# Patient Record
Sex: Male | Born: 1950 | Race: White | Hispanic: No | Marital: Married | Smoking: Former smoker
Health system: Southern US, Community
[De-identification: ages and names within clinical notes are randomized; demographics above are authoritative.]

## PROBLEM LIST (undated history)

## (undated) DIAGNOSIS — I10 Essential (primary) hypertension: Secondary | ICD-10-CM

## (undated) HISTORY — PX: EYE SURGERY: SHX253

---

## 2011-01-18 ENCOUNTER — Encounter: Payer: Self-pay | Admitting: Family Medicine

## 2011-01-18 ENCOUNTER — Ambulatory Visit (INDEPENDENT_AMBULATORY_CARE_PROVIDER_SITE_OTHER): Payer: BC Managed Care – PPO | Admitting: Family Medicine

## 2011-01-18 DIAGNOSIS — M7660 Achilles tendinitis, unspecified leg: Secondary | ICD-10-CM

## 2011-01-26 ENCOUNTER — Telehealth (INDEPENDENT_AMBULATORY_CARE_PROVIDER_SITE_OTHER): Payer: Self-pay | Admitting: *Deleted

## 2011-01-26 NOTE — Assessment & Plan Note (Signed)
Summary: RIGHT HEEL PAIN (rm 4)   Vital Signs:  Patient Profile:   60 Years Old Male CC:      right heel pain x 2 days Height:     68.5 inches Weight:      145 pounds O2 Sat:      99 % O2 treatment:    Room Air Temp:     98.7 degrees F oral Pulse rate:   89 / minute Resp:     16 per minute BP sitting:   151 / 81  (left arm) Cuff size:   regular  Pt. in pain?   yes    Location:   right heel    Type:       dull/ache  Vitals Entered By: Lajean Saver RN (January 18, 2011 3:55 PM)                   Updated Prior Medication List: LISINOPRIL-HYDROCHLOROTHIAZIDE 20-12.5 MG TABS (LISINOPRIL-HYDROCHLOROTHIAZIDE) once daily  Current Allergies: No known allergies History of Present Illness Chief Complaint: right heel pain x 2 days History of Present Illness:  Subjective:  Patient pushed a car three days ago, and that evening noticed soreness in the posterior aspect of his right heel.  He has been applying ice.  The soreness has improved, but he has discomfort when climbing stairs. He notes that heel feels better when he wears an ankle/heel brace.  REVIEW OF SYSTEMS Constitutional Symptoms      Denies fever, chills, night sweats, weight loss, weight gain, and fatigue.  Eyes       Denies change in vision, eye pain, eye discharge, glasses, contact lenses, and eye surgery. Ear/Nose/Throat/Mouth       Denies hearing loss/aids, change in hearing, ear pain, ear discharge, dizziness, frequent runny nose, frequent nose bleeds, sinus problems, sore throat, hoarseness, and tooth pain or bleeding.  Respiratory       Denies dry cough, productive cough, wheezing, shortness of breath, asthma, bronchitis, and emphysema/COPD.  Cardiovascular       Denies murmurs, chest pain, and tires easily with exhertion.    Gastrointestinal       Denies stomach pain, nausea/vomiting, diarrhea, constipation, blood in bowel movements, and indigestion. Genitourniary       Denies painful urination, kidney  stones, and loss of urinary control. Neurological       Denies paralysis, seizures, and fainting/blackouts. Musculoskeletal       Complains of muscle pain, joint pain, and decreased range of motion.      Denies joint stiffness, redness, swelling, muscle weakness, and gout.      Comments: right heel Skin       Denies bruising, unusual mles/lumps or sores, and hair/skin or nail changes.  Psych       Denies mood changes, temper/anger issues, anxiety/stress, speech problems, depression, and sleep problems.  Past History:  Past Medical History: htn glacuoma  Past Surgical History: left corneal transplant Hemorrhoidectomy  Social History: Never Smoked Alcohol use-yes Drug use-no Smoking Status:  never Drug Use:  no   Objective:  Appearance:  Patient appears healthy, stated age, and in no acute distress  Right posterior heel:  No swelling, erythema, or warmth.  There is point tenderness at insertion achilles tendon.  Ankle has full range of motion.  Mild pain elicited with resisted plantar flexion of right ankle. Assessment New Problems: ACHILLES TENDINITIS (ICD-726.71)   Plan New Medications/Changes: LORTAB 5 5-500 MG TABS (HYDROCODONE-ACETAMINOPHEN) One or two tabs by mouth hs  as needed pain  #7 (seven) x 0, 01/18/2011, Donna Christen MD NAPROXEN 500 MG TABS (NAPROXEN) One by mouth two times a day pc  #14 x 1, 01/18/2011, Donna Christen MD  New Orders: New Patient Level III [id number] Planning Comments:   Continue applying ice pack.  Continue ankle/achilles brace.  Begin Naproxen; analgesic at bedtime.  Begin stretching exercises ([facility] information and instruction patient handout given)  If not improving 10 to 14 days follow-up with sports med clinic   The patient and/or caregiver has been counseled thoroughly with regard to medications prescribed including dosage, schedule, interactions, rationale for use, and possible side effects and they verbalize understanding.   Diagnoses and expected course of recovery discussed and will return if not improved as expected or if the condition worsens. Patient and/or caregiver verbalized understanding.  Prescriptions: LORTAB 5 5-500 MG TABS (HYDROCODONE-ACETAMINOPHEN) One or two tabs by mouth hs as needed pain  #7 (seven) x 0   Entered and Authorized by:   Donna Christen MD   Signed by:   Donna Christen MD on 01/18/2011   Method used:   Print then Give to Patient   RxID:   [id number] NAPROXEN 500 MG TABS (NAPROXEN) One by mouth two times a day pc  #14 x 1   Entered and Authorized by:   Donna Christen MD   Signed by:   Donna Christen MD on 01/18/2011   Method used:   Print then Give to Patient   RxID:   [id number]   Orders Added: 1)  New Patient Level III [29528]

## 2011-02-04 NOTE — Progress Notes (Signed)
  Phone Note Outgoing Call   Call placed by: Clemens Catholic LPN,  January 26, 2011 5:14 PM Call placed to: Patient Summary of Call: call back : pt states that his heel is feeling better, still a little tender. told him to call back if he has any questions or concerns. Initial call taken by: Clemens Catholic LPN,  January 26, 2011 5:14 PM

## 2012-03-27 ENCOUNTER — Emergency Department
Admission: EM | Admit: 2012-03-27 | Discharge: 2012-03-27 | Disposition: A | Discharge status: Home / Self Care | Payer: BC Managed Care – PPO | Source: Home / Self Care | Attending: Family Medicine | Admitting: Family Medicine

## 2012-03-27 ENCOUNTER — Encounter: Payer: Self-pay | Admitting: Emergency Medicine

## 2012-03-27 DIAGNOSIS — J069 Acute upper respiratory infection, unspecified: Secondary | ICD-10-CM

## 2012-03-27 HISTORY — DX: Essential (primary) hypertension: I10

## 2012-03-27 NOTE — ED Notes (Signed)
Sinus drainage, headache, productive cough, runny nose x 4 days

## 2012-03-27 NOTE — Discharge Instructions (Signed)
Using Saline Nose Drops with Bulb Syringe A bulb syringe is used to clear your infant's nose and mouth. You may use it when your infant spits up, has a stuffy nose, or sneezes. Infants cannot blow their nose so you need to use a bulb syringe to clear their airway. This helps your infant suck on a bottle or nurse and still be able to breathe. USING THE BULB SYRINGE  Squeeze the air out of the bulb before inserting it into your infant's nose.   While still squeezing the bulb flat, place the tip of the bulb into a nostril. Let air come back into the bulb. The suction will pull snot out of the nose and into the bulb.   Repeat on the other nostril.   Squeeze syringe several times into a tissue.  USE THE BULB IN COMBINATION WITH SALINE NOSE DROPS  Put 1 or 2 salt water drops in each side of infant's nose with a clean medicine dropper.   Salt water nose drops will then moisten your infant's congested nose and loosen secretions before suctioning.   Use the bulb syringe as directed above.   Do not dry suction your infants nostrils. This can irritate their nostrils.  You can buy nose drops at your local drug store. You can also make nose drops yourself. Mix 1 cup of water with  teaspoon of salt. Stir. Store this mixture at room temperature. Make a new batch daily. CLEANING THE BULB SYRINGE Clean the bulb syringe every day with hot soapy water.   Clean the inside of the bulb by squeezing the bulb while the tip is in soapy water.   Rinse by squeezing the bulb while the tip is in clean hot water.   Store the bulb with the tip side down on paper towel.  HOME CARE INSTRUCTIONS   Use saline nose drops often to keep the nose open and not stuffy. It works better than suctioning with the bulb syringe, which can cause minor bruising inside the child's nose. Sometimes, you may have to use bulb suctioning. However, it is strongly believed that saline rinsing of the nostrils is more effective in keeping the  nose open. This is especially important for the infant who needs an open nose to be able to suck with a closed mouth.   Throw away used salt water. Make a new solution every time.   Always clean your child's nose before feeding.   Do not use the same solution and dropper for another child.  Document Released: 04/12/2008 Document Revised: 10/14/2011 Document Reviewed: 04/12/2008 [facility] Patient Information 2012 [place], [place].Upper Respiratory Infection, Adult An upper respiratory infection (URI) is also known as the common cold. It is often caused by a type of germ (virus). Colds are easily spread (contagious). You can pass it to others by kissing, coughing, sneezing, or drinking out of the same glass. Usually, you get better in 1 or 2 weeks.  HOME CARE   Only take medicine as told by your doctor.   Use a warm mist humidifier or breathe in steam from a hot shower.   Drink enough water and fluids to keep your pee (urine) clear or pale yellow.   Get plenty of rest.   Return to work when your temperature is back to normal or as told by your doctor. You may use a face mask and wash your hands to stop your cold from spreading.  GET HELP RIGHT AWAY IF:   After the first few days,  you feel you are getting worse.   You have questions about your medicine.   You have chills, shortness of breath, or brown or red spit (mucus).   You have yellow or brown snot (nasal discharge) or pain in the face, especially when you bend forward.   You have a fever, puffy (swollen) neck, pain when you swallow, or white spots in the back of your throat.   You have a bad headache, ear pain, sinus pain, or chest pain.   You have a high-pitched whistling sound when you breathe in and out (wheezing).   You have a lasting cough or cough up blood.   You have sore muscles or a stiff neck.  MAKE SURE YOU:   Understand these instructions.   Will watch your condition.   Will get help right away if you are  not doing well or get worse.  Document Released: 04/12/2008 Document Revised: 10/14/2011 Document Reviewed: 03/01/2011 [facility] Patient Information 2012 [place], [place].

## 2012-03-27 NOTE — ED Provider Notes (Signed)
History     CSN: [id number]  Arrival date & time 03/27/12  1029   First MD Initiated Contact with Patient 03/27/12 1040      Chief Complaint  Patient presents with  . Sinus Problem    (Consider location/radiation/quality/duration/timing/severity/associated sxs/prior treatment) Patient is a 61 y.o. male presenting with sinus complaint. The history is provided by the patient.  Sinus Problem  Mike Bauer is a 61 y.o. male who complains of onset of sinus congestion for 4 days.  scratchy sore throat + cough, non productive No pleuritic pain No wheezing + nasal congestion + post-nasal drainage No sinus pain/pressure No chest congestion + itchy/red eyes No earache No hemoptysis No SOB No chills/sweats No fever No nausea No vomiting No abdominal pain No diarrhea No skin rashes No fatigue No myalgias No headache    Past Medical History  Diagnosis Date  . Hypertension     History reviewed. No pertinent past surgical history.  Family History  Problem Relation Age of Onset  . Hyperlipidemia Mother   . Hypertension Mother     History  Substance Use Topics  . Smoking status: Former Games developer  . Smokeless tobacco: Not on file  . Alcohol Use: Yes      Review of Systems  All other systems reviewed and are negative.    Allergies  Review of patient's allergies indicates no known allergies.  Home Medications   Current Outpatient Rx  Name Route Sig Dispense Refill  . OLMESARTAN MEDOXOMIL-HCTZ 20-12.5 MG PO TABS Oral Take 1 tablet by mouth daily.      BP 112/76  Pulse 77  Temp(Src) 98.4 F (36.9 C) (Oral)  Resp 16  Ht 5\' 9"  (1.753 m)  Wt 142 lb (64.411 kg)  BMI 20.97 kg/m2  SpO2 99%  Physical Exam  Nursing note and vitals reviewed. Constitutional: He is oriented to person, place, and time. Vital signs are normal. He appears well-developed and well-nourished. He is active and cooperative.  HENT:  Head: Normocephalic.  Right Ear: Hearing, tympanic  membrane, external ear and ear canal normal.  Left Ear: Hearing, tympanic membrane, external ear and ear canal normal.  Nose: Nose normal.  Mouth/Throat: Uvula is midline and mucous membranes are normal. Posterior oropharyngeal erythema present.  Eyes: Conjunctivae are normal. No scleral icterus. Left pupil is not reactive.       Hx of injury to left eye many years ago  Neck: Trachea normal. Neck supple.  Cardiovascular: Normal rate, regular rhythm and normal heart sounds.   Pulmonary/Chest: Effort normal and breath sounds normal.  Lymphadenopathy:       Head (right side): No tonsillar, no preauricular, no posterior auricular and no occipital adenopathy present.       Head (left side): No tonsillar, no preauricular, no posterior auricular and no occipital adenopathy present.    He has no cervical adenopathy.  Neurological: He is alert and oriented to person, place, and time. No cranial nerve deficit or sensory deficit.  Skin: Skin is warm and dry.  Psychiatric: He has a normal mood and affect. His speech is normal and behavior is normal. Judgment and thought content normal. Cognition and memory are normal.    ED Course  Procedures (including critical care time)  Labs Reviewed - No data to display No results found.   1. URI (upper respiratory infection)       MDM  Increase fluid intake, rest.  No antibiotics indicated at this time.  Begin expectorant/decongestant, topical decongestant-stay clear of OTC with "  D", saline nasal spray and/or saline irrigation, and cough suppressant at bedtime. Antihistamines of your choice (Claritin or Zyrtec).  Tylenol or Motrin for fever/discomfort.  Followup with PCP if not improving 7 to 10 days.         Johnsie Kindred, NP 03/27/12 1110

## 2012-03-28 NOTE — ED Provider Notes (Signed)
Agree with exam, assessment, and plan.   Lattie Haw, MD 03/28/12 [phone]

## 2013-05-15 ENCOUNTER — Emergency Department
Admission: EM | Admit: 2013-05-15 | Discharge: 2013-05-15 | Disposition: A | Discharge status: Home / Self Care | Payer: BC Managed Care – PPO | Source: Home / Self Care | Attending: Emergency Medicine | Admitting: Emergency Medicine

## 2013-05-15 ENCOUNTER — Encounter: Payer: Self-pay | Admitting: Emergency Medicine

## 2013-05-15 DIAGNOSIS — H8110 Benign paroxysmal vertigo, unspecified ear: Secondary | ICD-10-CM

## 2013-05-15 MED ORDER — MECLIZINE HCL 25 MG PO TABS: 25.0000 mg | ORAL_TABLET | Freq: Three times a day (TID) | ORAL | Status: AC | PRN

## 2013-05-15 NOTE — ED Provider Notes (Signed)
History    CSN: [id number] Arrival date & time 05/15/13  1003  First MD Initiated Contact with Patient 05/15/13 1040     Chief Complaint  Patient presents with  . Dizziness   (Consider location/radiation/quality/duration/timing/severity/associated sxs/prior Treatment) HPI This patient comes in with intermittent positional dizziness for the last few weeks.  He does have a family history of injury or problems.  The person does have any.  He does have blood pressure issues and takes his medicines faithfully.  No chest pain, short of breath, only minimal nausea during symptoms.  He states that the episodes only occur when changing positions such as from laying down to standing up for a while looking up to the ceiling.  He did take some Benadryl which helped.  No recent sinus infections or ear pain.  No ear ringing.  No weakness, numbness, visual problems.  Past Medical History  Diagnosis Date  . Hypertension    Past Surgical History  Procedure Laterality Date  . Eye surgery Left    Family History  Problem Relation Age of Onset  . Hyperlipidemia Mother   . Hypertension Mother    History  Substance Use Topics  . Smoking status: Former Games developer  . Smokeless tobacco: Not on file  . Alcohol Use: Yes    Review of Systems  All other systems reviewed and are negative.    Allergies  Review of patient's allergies indicates no known allergies.  Home Medications   Current Outpatient Rx  Name  Route  Sig  Dispense  Refill  . aspirin 81 MG tablet   Oral   Take 81 mg by mouth daily.         . dorzolamide-timolol (COSOPT) 22.3-6.8 MG/ML ophthalmic solution      1 drop 2 (two) times daily.         . hydrochlorothiazide (MICROZIDE) 12.5 MG capsule   Oral   Take 12.5 mg by mouth daily.         Marland Kitchen lisinopril (PRINIVIL,ZESTRIL) 20 MG tablet   Oral   Take 20 mg by mouth daily.         . meclizine (ANTIVERT) 25 MG tablet   Oral   Take 1 tablet (25 mg total) by mouth 3  (three) times daily as needed.   30 tablet   0   . olmesartan-hydrochlorothiazide (BENICAR HCT) 20-12.5 MG per tablet   Oral   Take 1 tablet by mouth daily.          BP 147/81  Pulse 73  Temp(Src) 98.2 F (36.8 C) (Oral)  Resp 16  Ht 5' 8.5" (1.74 m)  Wt 145 lb (65.772 kg)  BMI 21.72 kg/m2  SpO2 99% Physical Exam  Nursing note and vitals reviewed. Constitutional: He is oriented to person, place, and time. He appears well-developed and well-nourished. No distress.  HENT:  Head: Normocephalic and atraumatic.  Right Ear: Tympanic membrane and ear canal normal.  Left Ear: Tympanic membrane and ear canal normal.  Nose: Nose normal.  Mouth/Throat: Uvula is midline and oropharynx is clear and moist.  Eyes: No scleral icterus.  Left thigh abnormal pupil and pupillary reactivity (chornic and secondary to an old injury).  Positive horizontal my statements with extreme lateral deviation.  No vertical nystagmus.  Neck: Normal range of motion and full passive range of motion without pain. Neck supple. Carotid bruit is not present. No rigidity.  Cardiovascular: Normal rate, regular rhythm and normal heart sounds.   Pulmonary/Chest: Effort normal and  breath sounds normal. No respiratory distress. He has no decreased breath sounds. He has no wheezes.  Neurological: He is alert and oriented to person, place, and time. He displays normal reflexes. No cranial nerve deficit or sensory deficit. He displays a negative Romberg sign. GCS eye subscore is 4. GCS verbal subscore is 5. GCS motor subscore is 6.  Skin: Skin is warm. He is not diaphoretic.  Psychiatric: He has a normal mood and affect. His speech is normal.    ED Course  Procedures (including critical care time) Labs Reviewed - No data to display No results found. 1. BPPV (benign paroxysmal positional vertigo)     MDM   This patient appears to have benign positional vertigo.  He did not have any signs of any intracranial process or  Mnire's disease.  I gave him prescription for meclizine that he is having these for the next few days.  Sedation cautions are given.  He should hydrate as well.  Other treatment options discussed are Phenergan which he does not feel he needs because he did not bring nauseated.  If he improves then he can followup as needed.  If he is not improving, we need to consider getting Lyme's titers since he does have a history of being bit by ticks while working in his yard.  Patient understands and will call back.  Tilson is controlled blood pressure.  ER precautions are given for severe dizziness.  Marlaine Hind, MD 05/15/13 1102

## 2013-05-15 NOTE — ED Notes (Signed)
Gives history of 3 weeks intermittent dizziness, especially when changing position quickly. Taking HBP medications as directed. Has removed 2 ticks, and working out of doors. No actual loss of consciousness.

## 2013-08-25 ENCOUNTER — Encounter: Payer: Self-pay | Admitting: Emergency Medicine

## 2013-08-25 ENCOUNTER — Emergency Department
Admission: EM | Admit: 2013-08-25 | Discharge: 2013-08-25 | Disposition: A | Discharge status: Home / Self Care | Payer: BC Managed Care – PPO | Source: Home / Self Care | Attending: Family Medicine | Admitting: Family Medicine

## 2013-08-25 DIAGNOSIS — S61411A Laceration without foreign body of right hand, initial encounter: Secondary | ICD-10-CM

## 2013-08-25 DIAGNOSIS — S61409A Unspecified open wound of unspecified hand, initial encounter: Secondary | ICD-10-CM

## 2013-08-25 MED ORDER — TETANUS-DIPHTH-ACELL PERTUSSIS 5-2.5-18.5 LF-MCG/0.5 IM SUSP: 0.5000 mL | Freq: Once | INTRAMUSCULAR | Status: DC

## 2013-08-25 NOTE — ED Notes (Signed)
Cut palm of right hand on lawn mower belt PTA, rates pain 8/10

## 2013-08-25 NOTE — ED Provider Notes (Signed)
CSN: [id number]     Arrival date & time 08/25/13  1513 History   First MD Initiated Contact with Patient 08/25/13 1542     Chief Complaint  Patient presents with  . Extremity Laceration    right hand      HPI Comments: About 30 minutes ago, patient cut the palm of his right hand on the edge of a lawn mower belt pulley.  He does not remember his last tetanus immunization  Patient is a 62 y.o. male presenting with skin laceration. The history is provided by the patient.  Laceration Location:  Hand Hand laceration location:  R palm Length (cm):  2.5 Depth:  Through dermis Quality: straight   Bleeding: controlled   Time since incident:  30 minutes Laceration mechanism:  Metal edge Pain details:    Quality:  Aching   Severity:  Mild   Timing:  Constant   Progression:  Unchanged Foreign body present:  No foreign bodies Relieved by:  Nothing Worsened by:  Movement Tetanus status:  Unknown   Past Medical History  Diagnosis Date  . Hypertension    Past Surgical History  Procedure Laterality Date  . Eye surgery Left    Family History  Problem Relation Age of Onset  . Hyperlipidemia Mother   . Hypertension Mother    History  Substance Use Topics  . Smoking status: Former Games developer  . Smokeless tobacco: Not on file  . Alcohol Use: Yes    Review of Systems  All other systems reviewed and are negative.    Allergies  Review of patient's allergies indicates no known allergies.  Home Medications   Current Outpatient Rx  Name  Route  Sig  Dispense  Refill  . aspirin 81 MG tablet   Oral   Take 81 mg by mouth daily.         . dorzolamide-timolol (COSOPT) 22.3-6.8 MG/ML ophthalmic solution      1 drop 2 (two) times daily.         . hydrochlorothiazide (MICROZIDE) 12.5 MG capsule   Oral   Take 12.5 mg by mouth daily.         Marland Kitchen lisinopril (PRINIVIL,ZESTRIL) 20 MG tablet   Oral   Take 20 mg by mouth daily.         . meclizine (ANTIVERT) 25 MG tablet  Oral   Take 1 tablet (25 mg total) by mouth 3 (three) times daily as needed.   30 tablet   0   . olmesartan-hydrochlorothiazide (BENICAR HCT) 20-12.5 MG per tablet   Oral   Take 1 tablet by mouth daily.          BP 120/77  Pulse 103  Temp(Src) 98.1 F (36.7 C) (Oral)  Ht 5\' 8"  (1.727 m)  Wt 135 lb (61.236 kg)  BMI 20.53 kg/m2  SpO2 97% Physical Exam  Nursing note and vitals reviewed. Constitutional: He is oriented to person, place, and time. He appears well-developed and well-nourished. No distress.  HENT:  Head: Atraumatic.  Eyes: Conjunctivae are normal. Pupils are equal, round, and reactive to light.  Musculoskeletal: Normal range of motion.       Right hand: He exhibits laceration. He exhibits normal range of motion, no tenderness, normal two-point discrimination, normal capillary refill and no swelling. Normal sensation noted. Normal strength noted.       Hands: Palm of right hand has a simple 2.5cm long linear laceration through the dermis as noted on diagram.  Neurological: He is alert  and oriented to person, place, and time.  Skin: Skin is warm and dry.    ED Course  Procedures  Laceration Repair Discussed benefits and risks of procedure and verbal consent obtained. Using sterile technique and local 1% lidocaine without epinephrine, cleansed wound with Betadine followed by copious lavage with normal saline.  Wound carefully inspected for debris and foreign bodies; none found.  Wound closed with # 7, 4-0 interrupted nylon sutures.  Bacitracin and non-stick sterile dressing applied.  Wound precautions explained to patient.  Return for suture removal in 10 days.        MDM   1. Laceration of right hand, initial encounter    Tdap administered Change dressing daily and apply Bacitracin ointment to wound.  Keep wound clean and dry.  Return for any signs of infection (or follow-up with family doctor):  Increasing redness, swelling, pain, heat, drainage, etc. Return  in 10 days for suture removal.    Lattie Haw, MD 08/25/13 [phone]

## 2013-11-22 ENCOUNTER — Encounter: Payer: Self-pay | Admitting: Emergency Medicine

## 2013-11-22 ENCOUNTER — Emergency Department (INDEPENDENT_AMBULATORY_CARE_PROVIDER_SITE_OTHER)
Admission: EM | Admit: 2013-11-22 | Discharge: 2013-11-22 | Disposition: A | Discharge status: Home / Self Care | Payer: BC Managed Care – PPO | Source: Home / Self Care | Attending: Family Medicine | Admitting: Family Medicine

## 2013-11-22 DIAGNOSIS — J309 Allergic rhinitis, unspecified: Secondary | ICD-10-CM

## 2013-11-22 DIAGNOSIS — J069 Acute upper respiratory infection, unspecified: Secondary | ICD-10-CM

## 2013-11-22 DIAGNOSIS — J329 Chronic sinusitis, unspecified: Secondary | ICD-10-CM

## 2013-11-22 MED ORDER — AZITHROMYCIN 250 MG PO TABS: ORAL_TABLET | ORAL | Status: DC

## 2013-11-22 MED ORDER — METHYLPREDNISOLONE ACETATE 80 MG/ML IJ SUSP
80.0000 mg | Freq: Once | INTRAMUSCULAR | Status: AC
  Administered 2013-11-22: 80 mg via INTRAMUSCULAR

## 2013-11-22 MED ORDER — FLUTICASONE PROPIONATE 50 MCG/ACT NA SUSP: 2.0000 | Freq: Every day | NASAL | Status: AC

## 2013-11-22 MED ORDER — CETIRIZINE HCL 10 MG PO CAPS: 10.0000 mg | ORAL_CAPSULE | Freq: Every day | ORAL | Status: DC

## 2013-11-22 NOTE — ED Provider Notes (Addendum)
CSN: [id number]     Arrival date & time 11/22/13  1317 History   None    Chief Complaint  Patient presents with  . Cough    HPI URI Symptoms Onset: 2-3 months  Description: intermittent, rhinorrhea, nasal congestion, cough  Modifying factors:  none  Symptoms Nasal discharge: yes Fever: no Sore throat: no Cough: yes Wheezing: no Ear pain: no GI symptoms: no Sick contacts: no  Red Flags  Stiff neck: no Dyspnea: no Rash: no Swallowing difficulty: no  Sinusitis Risk Factors Headache/face pain: no Double sickening: no tooth pain: no  Allergy Risk Factors Sneezing: yes Itchy scratchy throat: yes Seasonal symptoms: yes  Flu Risk Factors Headache: no muscle aches: no severe fatigue: no    Past Medical History  Diagnosis Date  . Hypertension    Past Surgical History  Procedure Laterality Date  . Eye surgery Left    Family History  Problem Relation Age of Onset  . Hyperlipidemia Mother   . Hypertension Mother    History  Substance Use Topics  . Smoking status: Former Smoker    Quit date: 11/22/1998  . Smokeless tobacco: Never Used  . Alcohol Use: Yes    Review of Systems  All other systems reviewed and are negative.    Allergies  Review of patient's allergies indicates no known allergies.  Home Medications   Current Outpatient Rx  Name  Route  Sig  Dispense  Refill  . aspirin 81 MG tablet   Oral   Take 81 mg by mouth daily.         . dorzolamide-timolol (COSOPT) 22.3-6.8 MG/ML ophthalmic solution      1 drop 2 (two) times daily.         . hydrochlorothiazide (MICROZIDE) 12.5 MG capsule   Oral   Take 12.5 mg by mouth daily.         Marland Kitchen. lisinopril (PRINIVIL,ZESTRIL) 20 MG tablet   Oral   Take 20 mg by mouth daily.         Marland Kitchen. azithromycin (ZITHROMAX) 250 MG tablet      Take 2 tabs PO x 1 dose, then 1 tab PO QD x 4 days   6 tablet   0   . Cetirizine HCl 10 MG CAPS   Oral   Take 1 capsule (10 mg total) by mouth daily.   30  capsule   3   . fluticasone (FLONASE) 50 MCG/ACT nasal spray   Each Nare   Place 2 sprays into both nostrils daily.   16 g   12   . meclizine (ANTIVERT) 25 MG tablet   Oral   Take 1 tablet (25 mg total) by mouth 3 (three) times daily as needed.   30 tablet   0    BP 123/73  Pulse 91  Temp(Src) 98.6 F (37 C) (Oral)  Resp 12  Wt 137 lb (62.143 kg)  SpO2 98% Physical Exam  Constitutional: He appears well-developed and well-nourished.  HENT:  Head: Normocephalic and atraumatic.  Right Ear: External ear normal.  Left Ear: External ear normal.  +nasal erythema, rhinorrhea bilaterally, + post oropharyngeal erythema    Eyes: Conjunctivae are normal. Pupils are equal, round, and reactive to light.  Neck: Normal range of motion. Neck supple.  Cardiovascular: Normal rate and regular rhythm.   Pulmonary/Chest: Effort normal and breath sounds normal.  Abdominal: Soft. Bowel sounds are normal.  Musculoskeletal: Normal range of motion.  Neurological: He is alert.    ED  Course  Procedures (including critical care time) Labs Review Labs Reviewed - No data to display Imaging Review No results found.  EKG Interpretation    Date/Time:    Ventricular Rate:    PR Interval:    QRS Duration:   QT Interval:    QTC Calculation:   R Axis:     Text Interpretation:              MDM   1. URI (upper respiratory infection)   2. Allergic rhinitis    Likley overlapping URI and allergic rhinitis.  MIld component of sinusitis.  Depomedrol 80mg  IM x1 Will place on course of zpak  Start flonase and zyrtec.  Discussed infectious and ENT red flags.  Follow up as needed.     The patient and/or caregiver has been counseled thoroughly with regard to treatment plan and/or medications prescribed including dosage, schedule, interactions, rationale for use, and possible side effects and they verbalize understanding. Diagnoses and expected course of recovery discussed and will  return if not improved as expected or if the condition worsens. Patient and/or caregiver verbalized understanding.         Doree Albee, MD 11/22/13 1411  Doree Albee, MD 01/03/14 1055

## 2013-11-22 NOTE — ED Notes (Signed)
Mike Bauer c/o intermittent cough with runny nose and sinus congestion since Nov 2014. No flu vac this season.

## 2013-11-24 ENCOUNTER — Telehealth: Payer: Self-pay | Admitting: Emergency Medicine

## 2016-04-30 ENCOUNTER — Emergency Department (INDEPENDENT_AMBULATORY_CARE_PROVIDER_SITE_OTHER)
Admission: EM | Admit: 2016-04-30 | Discharge: 2016-04-30 | Disposition: A | Discharge status: Home / Self Care | Payer: Medicare HMO | Source: Home / Self Care

## 2016-04-30 ENCOUNTER — Emergency Department (INDEPENDENT_AMBULATORY_CARE_PROVIDER_SITE_OTHER): Payer: Medicare HMO

## 2016-04-30 ENCOUNTER — Encounter: Payer: Self-pay | Admitting: *Deleted

## 2016-04-30 DIAGNOSIS — M5441 Lumbago with sciatica, right side: Secondary | ICD-10-CM

## 2016-04-30 DIAGNOSIS — M5135 Other intervertebral disc degeneration, thoracolumbar region: Secondary | ICD-10-CM | POA: Diagnosis not present

## 2016-04-30 DIAGNOSIS — I700 Atherosclerosis of aorta: Secondary | ICD-10-CM | POA: Diagnosis not present

## 2016-04-30 DIAGNOSIS — M5126 Other intervertebral disc displacement, lumbar region: Secondary | ICD-10-CM | POA: Diagnosis not present

## 2016-04-30 MED ORDER — PREDNISONE 20 MG PO TABS: ORAL_TABLET | ORAL | Status: DC

## 2016-04-30 NOTE — Discharge Instructions (Signed)
Apply ice pack for 20 to 30 minutes, 3 to 4 times daily  Continue until pain decreases.  May take Tylenol if needed for pain at bedtime.  Begin back exercises as tolerated.   Sciatica Sciatica is pain, weakness, numbness, or tingling along the path of the sciatic nerve. The nerve starts in the lower back and runs down the back of each leg. The nerve controls the muscles in the lower leg and in the back of the knee, while also providing sensation to the back of the thigh, lower leg, and the sole of your foot. Sciatica is a symptom of another medical condition. For instance, nerve damage or certain conditions, such as a herniated disk or bone spur on the spine, pinch or put pressure on the sciatic nerve. This causes the pain, weakness, or other sensations normally associated with sciatica. Generally, sciatica only affects one side of the body. CAUSES  1. Herniated or slipped disc. 2. Degenerative disk disease. 3. A pain disorder involving the narrow muscle in the buttocks (piriformis syndrome). 4. Pelvic injury or fracture. 5. Pregnancy. 6. Tumor (rare). SYMPTOMS  Symptoms can vary from mild to very severe. The symptoms usually travel from the low back to the buttocks and down the back of the leg. Symptoms can include: 1. Mild tingling or dull aches in the lower back, leg, or hip. 2. Numbness in the back of the calf or sole of the foot. 3. Burning sensations in the lower back, leg, or hip. 4. Sharp pains in the lower back, leg, or hip. 5. Leg weakness. 6. Severe back pain inhibiting movement. These symptoms may get worse with coughing, sneezing, laughing, or prolonged sitting or standing. Also, being overweight may worsen symptoms. DIAGNOSIS  Your caregiver will perform a physical exam to look for common symptoms of sciatica. He or she may ask you to do certain movements or activities that would trigger sciatic nerve pain. Other tests may be performed to find the cause of the sciatica. These may  include: 1. Blood tests. 2. X-rays. 3. Imaging tests, such as an MRI or CT scan. TREATMENT  Treatment is directed at the cause of the sciatic pain. Sometimes, treatment is not necessary and the pain and discomfort goes away on its own. If treatment is needed, your caregiver may suggest: 1. Over-the-counter medicines to relieve pain. 2. Prescription medicines, such as anti-inflammatory medicine, muscle relaxants, or narcotics. 3. Applying heat or ice to the painful area. 4. Steroid injections to lessen pain, irritation, and inflammation around the nerve. 5. Reducing activity during periods of pain. 6. Exercising and stretching to strengthen your abdomen and improve flexibility of your spine. Your caregiver may suggest losing weight if the extra weight makes the back pain worse. 7. Physical therapy. 8. Surgery to eliminate what is pressing or pinching the nerve, such as a bone spur or part of a herniated disk. HOME CARE INSTRUCTIONS  1. Only take over-the-counter or prescription medicines for pain or discomfort as directed by your caregiver. 2. Apply ice to the affected area for 20 minutes, 3-4 times a day for the first 48-72 hours. Then try heat in the same way. 3. Exercise, stretch, or perform your usual activities if these do not aggravate your pain. 4. Attend physical therapy sessions as directed by your caregiver. 5. Keep all follow-up appointments as directed by your caregiver. 6. Do not wear high heels or shoes that do not provide proper support. 7. Check your mattress to see if it is too soft.  A firm mattress may lessen your pain and discomfort. SEEK IMMEDIATE MEDICAL CARE IF:  1. You lose control of your bowel or bladder (incontinence). 2. You have increasing weakness in the lower back, pelvis, buttocks, or legs. 3. You have redness or swelling of your back. 4. You have a burning sensation when you urinate. 5. You have pain that gets worse when you lie down or awakens you at  night. 6. Your pain is worse than you have experienced in the past. 7. Your pain is lasting longer than 4 weeks. 8. You are suddenly losing weight without reason. MAKE SURE YOU: 1. Understand these instructions. 2. Will watch your condition. 3. Will get help right away if you are not doing well or get worse.   This information is not intended to replace advice given to you by your health care provider. Make sure you discuss any questions you have with your health care provider.   Document Released: 10/19/2001 Document Revised: 07/16/2015 Document Reviewed: 03/05/2012 Elsevier Interactive Patient Education 2016 [employer]      Back Exercises The following exercises strengthen the muscles that help to support the back. They also help to keep the lower back flexible. Doing these exercises can help to prevent back pain or lessen existing pain. If you have back pain or discomfort, try doing these exercises 2-3 times each day or as told by your health care provider. When the pain goes away, do them once each day, but increase the number of times that you repeat the steps for each exercise (do more repetitions). If you do not have back pain or discomfort, do these exercises once each day or as told by your health care provider. EXERCISES Single Knee to Chest Repeat these steps 3-5 times for each leg: 7. Lie on your back on a firm bed or the floor with your legs extended. 8. Bring one knee to your chest. Your other leg should stay extended and in contact with the floor. 9. Hold your knee in place by grabbing your knee or thigh. 10. Pull on your knee until you feel a gentle stretch in your lower back. 11. Hold the stretch for 10-30 seconds. 12. Slowly release and straighten your leg. Pelvic Tilt Repeat these steps 5-10 times: 7. Lie on your back on a firm bed or the floor with your legs extended. 8. Bend your knees so they are pointing toward the ceiling and your feet are flat on the  floor. 9. Tighten your lower abdominal muscles to press your lower back against the floor. This motion will tilt your pelvis so your tailbone points up toward the ceiling instead of pointing to your feet or the floor. 10. With gentle tension and even breathing, hold this position for 5-10 seconds. Cat-Cow Repeat these steps until your lower back becomes more flexible: 4. Get into a hands-and-knees position on a firm surface. Keep your hands under your shoulders, and keep your knees under your hips. You may place padding under your knees for comfort. 5. Let your head hang down, and point your tailbone toward the floor so your lower back becomes rounded like the back of a cat. 6. Hold this position for 5 seconds. 7. Slowly lift your head and point your tailbone up toward the ceiling so your back forms a sagging arch like the back of a cow. 8. Hold this position for 5 seconds. Press-Ups Repeat these steps 5-10 times: 9. Lie on your abdomen (face-down) on the floor. 10. Place your  palms near your head, about shoulder-width apart. 11. While you keep your back as relaxed as possible and keep your hips on the floor, slowly straighten your arms to raise the top half of your body and lift your shoulders. Do not use your back muscles to raise your upper torso. You may adjust the placement of your hands to make yourself more comfortable. 12. Hold this position for 5 seconds while you keep your back relaxed. 13. Slowly return to lying flat on the floor. Bridges Repeat these steps 10 times: 8. Lie on your back on a firm surface. 9. Bend your knees so they are pointing toward the ceiling and your feet are flat on the floor. 10. Tighten your buttocks muscles and lift your buttocks off of the floor until your waist is at almost the same height as your knees. You should feel the muscles working in your buttocks and the back of your thighs. If you do not feel these muscles, slide your feet 1-2 inches farther away  from your buttocks. 11. Hold this position for 3-5 seconds. 12. Slowly lower your hips to the starting position, and allow your buttocks muscles to relax completely. If this exercise is too easy, try doing it with your arms crossed over your chest. Abdominal Crunches Repeat these steps 5-10 times: 9. Lie on your back on a firm bed or the floor with your legs extended. 10. Bend your knees so they are pointing toward the ceiling and your feet are flat on the floor. 11. Cross your arms over your chest. 12. Tip your chin slightly toward your chest without bending your neck. 13. Tighten your abdominal muscles and slowly raise your trunk (torso) high enough to lift your shoulder blades a tiny bit off of the floor. Avoid raising your torso higher than that, because it can put too much stress on your low back and it does not help to strengthen your abdominal muscles. 14. Slowly return to your starting position. Back Lifts Repeat these steps 5-10 times: 4. Lie on your abdomen (face-down) with your arms at your sides, and rest your forehead on the floor. 5. Tighten the muscles in your legs and your buttocks. 6. Slowly lift your chest off of the floor while you keep your hips pressed to the floor. Keep the back of your head in line with the curve in your back. Your eyes should be looking at the floor. 7. Hold this position for 3-5 seconds. 8. Slowly return to your starting position. SEEK MEDICAL CARE IF:  Your back pain or discomfort gets much worse when you do an exercise.  Your back pain or discomfort does not lessen within 2 hours after you exercise. If you have any of these problems, stop doing these exercises right away. Do not do them again unless your health care provider says that you can. SEEK IMMEDIATE MEDICAL CARE IF:  You develop sudden, severe back pain. If this happens, stop doing the exercises right away. Do not do them again unless your health care provider says that you can.   This  information is not intended to replace advice given to you by your health care provider. Make sure you discuss any questions you have with your health care provider.   Document Released: 12/02/2004 Document Revised: 07/16/2015 Document Reviewed: 12/19/2014 Elsevier Interactive Patient Education [employer]

## 2016-04-30 NOTE — ED Provider Notes (Signed)
CSN: [id number]     Arrival date & time 04/30/16  1046 History   None    Chief Complaint  Patient presents with  . Back Pain      HPI Comments: Patient reports that he injured his lower back in 1981 and recovered but has had occasional flareups of pain through the years.  He is very active outside and about 3 months ago he developed persistent lower back pain.  The pain has been radiating to his anterior thighs, and during the past two weeks he has had pain radiating to his right toes.   He denies bowel or bladder dysfunction, and no saddle numbness.  The pain has not responded to hot showers and ibuprofen.    Patient is a 65 y.o. male presenting with back pain. The history is provided by the patient.  Back Pain Location:  Lumbar spine Quality:  Aching Radiates to:  L posterior upper leg, R posterior upper leg, R foot, R thigh and L thigh Pain severity:  Moderate Pain is:  Same all the time Onset quality:  Gradual Duration:  3 months Timing:  Constant Progression:  Worsening Chronicity:  Chronic Context: lifting heavy objects   Context: not recent injury   Relieved by:  Nothing Worsened by:  Movement Ineffective treatments:  NSAIDs and heating pad Associated symptoms: leg pain and paresthesias   Associated symptoms: no abdominal pain, no abdominal swelling, no bladder incontinence, no bowel incontinence, no chest pain, no dysuria, no fever, no numbness, no pelvic pain, no perianal numbness, no tingling, no weakness and no weight loss     Past Medical History  Diagnosis Date  . Hypertension    Past Surgical History  Procedure Laterality Date  . Eye surgery Left    Family History  Problem Relation Age of Onset  . Hyperlipidemia Mother   . Hypertension Mother    Social History  Substance Use Topics  . Smoking status: Former Smoker    Quit date: 11/22/1998  . Smokeless tobacco: Never Used  . Alcohol Use: Yes    Review of Systems  Constitutional: Negative for fever and  weight loss.  Cardiovascular: Negative for chest pain.  Gastrointestinal: Negative for abdominal pain and bowel incontinence.  Genitourinary: Negative for bladder incontinence, dysuria and pelvic pain.  Musculoskeletal: Positive for back pain.  Neurological: Positive for paresthesias. Negative for tingling, weakness and numbness.  All other systems reviewed and are negative.   Allergies  Review of patient's allergies indicates no known allergies.  Home Medications   Prior to Admission medications   Medication Sig Start Date End Date Taking? Authorizing Provider  aspirin 81 MG tablet Take 81 mg by mouth daily.    Historical Provider, MD  Cetirizine HCl 10 MG CAPS Take 1 capsule (10 mg total) by mouth daily. 11/22/13   Floydene FlockSteven J Newton, MD  dorzolamide-timolol (COSOPT) 22.3-6.8 MG/ML ophthalmic solution 1 drop 2 (two) times daily.    Historical Provider, MD  fluticasone (FLONASE) 50 MCG/ACT nasal spray Place 2 sprays into both nostrils daily. 11/22/13   Floydene FlockSteven J Newton, MD  hydrochlorothiazide (MICROZIDE) 12.5 MG capsule Take 12.5 mg by mouth daily.    Historical Provider, MD  lisinopril (PRINIVIL,ZESTRIL) 20 MG tablet Take 20 mg by mouth daily.    Historical Provider, MD  meclizine (ANTIVERT) 25 MG tablet Take 1 tablet (25 mg total) by mouth 3 (three) times daily as needed. 05/15/13   Marlaine HindJeffrey H Henderson, MD  predniSONE (DELTASONE) 20 MG tablet Take one tab  by mouth twice daily for 5 days, then one daily for 3 days. Take with food. 04/30/16   Lattie HawStephen A Beese, MD   Meds Ordered and Administered this Visit  Medications - No data to display  BP 137/83 mmHg  Pulse 62  Temp(Src) 98.3 F (36.8 C) (Oral)  Wt 135 lb (61.236 kg)  SpO2 99% No data found.   Physical Exam  Constitutional: He is oriented to person, place, and time. He appears well-developed and well-nourished. No distress.  HENT:  Head: Atraumatic.  Nose: Nose normal.  Mouth/Throat: Oropharynx is clear and moist.  Eyes:  Conjunctivae are normal. Pupils are equal, round, and reactive to light.  Neck: Normal range of motion.  Cardiovascular: Normal heart sounds.   Pulmonary/Chest: Breath sounds normal.  Abdominal: Bowel sounds are normal. There is no tenderness.  Musculoskeletal: Normal range of motion. He exhibits no edema.       Lumbar back: He exhibits tenderness and bony tenderness. He exhibits normal range of motion, no swelling and no edema.       Back:  Back:  Range of motion relatively well preserved.  Can heel/toe walk and squat without difficulty.     Tenderness in the midline and right paraspinous muscles from L3 to L5.  Straight leg raising test is negative.  Sitting knee extension test is negative.  Strength and sensation in the lower extremities is normal.  Patellar and achilles reflexes are normal   Lymphadenopathy:    He has no cervical adenopathy.  Neurological: He is alert and oriented to person, place, and time. He has normal reflexes.  Skin: Skin is warm and dry. No rash noted.  Nursing note and vitals reviewed.   ED Course  Procedures  None  Imaging Review Dg Lumbar Spine Complete  04/30/2016  CLINICAL DATA:  Radicular low back pain for 3 months. No recent injury. EXAM: LUMBAR SPINE - COMPLETE 4+ VIEW COMPARISON:  None. FINDINGS: This report assumes 5 non rib-bearing lumbar vertebrae. Lumbar vertebral body heights are preserved, with no fracture. Mild-to-moderate spondylosis in the visualized thoracolumbar spine with mild loss of disc height at L1-2 and L4-5. No spondylolisthesis. Mild facet arthropathy bilaterally in the lower lumbar spine. No aggressive appearing focal osseous lesions. Marked atherosclerosis in the abdominal aorta. IMPRESSION: 1. No lumbar fracture or spondylolisthesis. 2. 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 3. Aortic atherosclerosis. Electronically Signed   By: Delbert PhenixJason A Poff M.D.   On: 04/30/2016 12:20      MDM   1.  Right-sided low back pain with sciatica, sciatica laterality unspecified    Begin prednisone burst/taper. Apply ice pack for 20 to 30 minutes, 3 to 4 times daily  Continue until pain decreases.  May take Tylenol if needed for pain at bedtime.  Begin back exercises as tolerated. Followup with Dr. Rodney Langtonhomas Thekkekandam or Dr. Clementeen GrahamEvan Corey ([facility]) if not improving about two weeks.     Lattie HawStephen A Beese, MD 04/30/16 1250

## 2016-04-30 NOTE — ED Notes (Signed)
Pt c/o low back pain from injury in the 80s. Ever since he's had intermittent problems. Most recently the past 2-3 months it has flared up causing radiating pain down the front of his legs. Used IBF @ home and back exercises.

## 2016-06-29 ENCOUNTER — Ambulatory Visit (INDEPENDENT_AMBULATORY_CARE_PROVIDER_SITE_OTHER): Payer: Medicare HMO | Admitting: Sports Medicine

## 2016-06-29 DIAGNOSIS — M47816 Spondylosis without myelopathy or radiculopathy, lumbar region: Secondary | ICD-10-CM | POA: Diagnosis not present

## 2016-06-29 MED ORDER — PREDNISONE 50 MG PO TABS: ORAL_TABLET | ORAL | 0 refills | Status: DC

## 2016-06-29 MED ORDER — MELOXICAM 15 MG PO TABS: ORAL_TABLET | ORAL | 3 refills | Status: AC

## 2016-06-29 NOTE — Assessment & Plan Note (Signed)
Adding another course of prednisone, meloxicam, formal physical therapy. Pain is predominantly facetogenic. Return to see me in one month, MRI for interventional planning if no better.

## 2016-06-29 NOTE — Progress Notes (Signed)
   Subjective:    I'm seeing this patient as a consultation for:  Dr. Donna ChristenStephen Beese  CC: Low back pain  HPI: 2 months ago this pleasant 65 year old male was seen for back pain, he was given a prednisone taper which helped temporarily, x-ray showed multilevel spondylosis, he's back with recurrence of pain in the low back with radiation to the posterior thighs and the lateral lower legs, worse with standing, extension. Also worse with stiffness in the morning. No bowel or bladder dysfunction, saddle numbness, no pain with Valsalva or flexion. No constitutional symptoms.  Past medical history, Surgical history, Family history not pertinant except as noted below, Social history, Allergies, and medications have been entered into the medical record, reviewed, and no changes needed.   Review of Systems: No headache, visual changes, nausea, vomiting, diarrhea, constipation, dizziness, abdominal pain, skin rash, fevers, chills, night sweats, weight loss, swollen lymph nodes, body aches, joint swelling, muscle aches, chest pain, shortness of breath, mood changes, visual or auditory hallucinations.   Objective:   General: Well Developed, well nourished, and in no acute distress.  Neuro/Psych: Alert and oriented x3, extra-ocular muscles intact, able to move all 4 extremities, sensation grossly intact. Skin: Warm and dry, no rashes noted.  Respiratory: Not using accessory muscles, speaking in full sentences, trachea midline.  Cardiovascular: Pulses palpable, no extremity edema. Abdomen: Does not appear distended. Back Exam:  Inspection: Unremarkable  Motion: Flexion 45 deg, Extension 45 deg, Side Bending to 45 deg bilaterally,  Rotation to 45 deg bilaterally  SLR laying: Negative  XSLR laying: Negative  Palpable tenderness: None. FABER: negative. Sensory change: Gross sensation intact to all lumbar and sacral dermatomes.  Reflexes: 2+ at both patellar tendons, 2+ at achilles tendons, Babinski's  downgoing.  Strength at foot  Plantar-flexion: 5/5 Dorsi-flexion: 5/5 Eversion: 5/5 Inversion: 5/5  Leg strength  Quad: 5/5 Hamstring: 5/5 Hip flexor: 5/5 Hip abductors: 5/5  Gait unremarkable.  Impression and Recommendations:   This case required medical decision making of moderate complexity.  Spondylosis of lumbar region without myelopathy or radiculopathy Adding another course of prednisone, meloxicam, formal physical therapy. Pain is predominantly facetogenic. Return to see me in one month, MRI for interventional planning if no better.

## 2016-07-05 ENCOUNTER — Encounter: Payer: Self-pay | Admitting: Rehabilitative and Restorative Service Providers"

## 2016-07-05 ENCOUNTER — Ambulatory Visit (INDEPENDENT_AMBULATORY_CARE_PROVIDER_SITE_OTHER): Payer: Medicare HMO | Admitting: Rehabilitative and Restorative Service Providers"

## 2016-07-05 DIAGNOSIS — M5432 Sciatica, left side: Secondary | ICD-10-CM | POA: Diagnosis not present

## 2016-07-05 DIAGNOSIS — R29898 Other symptoms and signs involving the musculoskeletal system: Secondary | ICD-10-CM | POA: Diagnosis not present

## 2016-07-05 DIAGNOSIS — M5431 Sciatica, right side: Secondary | ICD-10-CM

## 2016-07-05 NOTE — Patient Instructions (Signed)
HIP: Hamstrings - Supine   Place strap around foot. Raise leg up, keeping knee straight.  Bend opposite knee to protect back if indicated. Hold 30 seconds. 3 reps per set, 2-3 sets per day     Outer Hip Stretch: Reclined IT Band Stretch (Strap)   Strap around one foot, pull leg across body until you feel a pull or stretch, with shoulders on mat. Hold for 30 seconds. Repeat 3 times each leg. 2-3 times/day.  Piriformis Stretch   Lying on back, pull right knee toward opposite shoulder. Hold 30 seconds. Repeat 3 times. Do 2-3 sessions per day.   Sleeping on Back  Place pillow under knees. A pillow with cervical support and a roll around waist are also helpful. Copyright  VHI. All rights reserved.  Sleeping on Side Place pillow between knees. Use cervical support under neck and a roll around waist as needed. Copyright  VHI. All rights reserved.   Sleeping on Stomach   If this is the only desirable sleeping position, place pillow under lower legs, and under stomach or chest as needed.  Posture - Sitting   Sit upright, head facing forward. Try using a roll to support lower back. Keep shoulders relaxed, and avoid rounded back. Keep hips level with knees. Avoid crossing legs for long periods. Stand to Sit / Sit to Stand   To sit: Bend knees to lower self onto front edge of chair, then scoot back on seat. To stand: Reverse sequence by placing one foot forward, and scoot to front of seat. Use rocking motion to stand up.   Work Height and Reach  Ideal work height is no more than 2 to 4 inches below elbow level when standing, and at elbow level when sitting. Reaching should be limited to arm's length, with elbows slightly bent.  Bending  Bend at hips and knees, not back. Keep feet shoulder-width apart.    Posture - Standing   Good posture is important. Avoid slouching and forward head thrust. Maintain curve in low back and align ears over shoul- ders, hips over  ankles.  Alternating Positions   Alternate tasks and change positions frequently to reduce fatigue and muscle tension. Take rest breaks. Computer Work   Position work to Art gallery managerface forward. Use proper work and seat height. Keep shoulders back and down, wrists straight, and elbows at right angles. Use chair that provides full back support. Add footrest and lumbar roll as needed.  Getting Into / Out of Car  Lower self onto seat, scoot back, then bring in one leg at a time. Reverse sequence to get out.  Dressing  Lie on back to pull socks or slacks over feet, or sit and bend leg while keeping back straight.    Housework - Sink  Place one foot on ledge of cabinet under sink when standing at sink for prolonged periods.   Pushing / Pulling  Pushing is preferable to pulling. Keep back in proper alignment, and use leg muscles to do the work.  Deep Squat   Squat and lift with both arms held against upper trunk. Tighten stomach muscles without holding breath. Use smooth movements to avoid jerking.  Avoid Twisting   Avoid twisting or bending back. Pivot around using foot movements, and bend at knees if needed when reaching for articles.  Carrying Luggage   Distribute weight evenly on both sides. Use a cart whenever possible. Do not twist trunk. Move body as a unit.   Lifting Principles .Maintain proper posture and  head alignment. .Slide object as close as possible before lifting. .Move obstacles out of the way. .Test before lifting; ask for help if too heavy. .Tighten stomach muscles without holding breath. .Use smooth movements; do not jerk. .Use legs to do the work, and pivot with feet. .Distribute the work load symmetrically and close to the center of trunk. .Push instead of pull whenever possible.   Ask For Help   Ask for help and delegate to others when possible. Coordinate your movements when lifting together, and maintain the low back curve.  Log Roll   Lying on  back, bend left knee and place left arm across chest. Roll all in one movement to the right. Reverse to roll to the left. Always move as one unit. Housework - Sweeping  Use long-handled equipment to avoid stooping.   Housework - Wiping  Position yourself as close as possible to reach work surface. Avoid straining your back.  Laundry - Unloading Wash   To unload small items at bottom of washer, lift leg opposite to arm being used to reach.  Gardening - Raking  Move close to area to be raked. Use arm movements to do the work. Keep back straight and avoid twisting.     Cart  When reaching into cart with one arm, lift opposite leg to keep back straight.   Getting Into / Out of Bed  Lower self to lie down on one side by raising legs and lowering head at the same time. Use arms to assist moving without twisting. Bend both knees to roll onto back if desired. To sit up, start from lying on side, and use same move-ments in reverse. Housework - Vacuuming  Hold the vacuum with arm held at side. Step back and forth to move it, keeping head up. Avoid twisting.   Laundry - Armed forces training and education officer so that bending and twisting can be avoided.   Laundry - Unloading Dryer  Squat down to reach into clothes dryer or use a reacher.  Gardening - Weeding / Psychiatric nurse or Kneel. Knee pads may be helpful.                    TENS UNIT: This is helpful for muscle pain and spasm.   Search and Purchase a TENS 7000 2nd edition at www.tenspros.com. It should be less than $30.     TENS unit instructions: Do not shower or bathe with the unit on Turn the unit off before removing electrodes or batteries If the electrodes lose stickiness add a drop of water to the electrodes after they are disconnected from the unit and place on plastic sheet. If you continued to have difficulty, call the TENS unit company to purchase more electrodes. Do not apply lotion on the  skin area prior to use. Make sure the skin is clean and dry as this will help prolong the life of the electrodes. After use, always check skin for unusual red areas, rash or other skin difficulties. If there are any skin problems, does not apply electrodes to the same area. Never remove the electrodes from the unit by pulling the wires. Do not use the TENS unit or electrodes other than as directed. Do not change electrode placement without consultating your therapist or physician. Keep 2 fingers with between each electrode.

## 2016-07-05 NOTE — Therapy (Signed)
[facility] [place] [address] [place], [place], [address] Phone: [phone]   Fax:  [phone]  Physical Therapy Evaluation  Patient Details  Name: Mike Bauer MRN: [id number] Date of Birth: [birth year] Referring Provider: Dr. Benjamin Stain   Encounter Date: 07/05/2016      PT End of Session - 07/05/16 1312    Visit Number 1   Number of Visits 12   Date for PT Re-Evaluation 08/16/16   PT Start Time 0848   PT Stop Time 0940   PT Time Calculation (min) 52 min   Activity Tolerance Patient tolerated treatment well      Past Medical History:  Diagnosis Date  . Hypertension     Past Surgical History:  Procedure Laterality Date  . EYE SURGERY Left     There were no vitals filed for this visit.       Subjective Assessment - 07/05/16 0854    Subjective Patient reports that he had sudden onset of LBP radiating into the groin area. Initial onset of pain was ~ 2 years with symptoms resolving with time. Began to have pain in the LB and radiating into the back of both legs 02/21/16 with some improvement with prednisone but symptoms returned when he stopped the medication. Continues to have LBP but more into the back of both legs into the calves of legs Rt > Lt    Pertinent History LBP ~ 1981 was hooking up a box scrap and reaced across and pulled when he felt sharp pain. 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    How long can you sit comfortably? 2-3 hours driving    How long can you stand comfortably? not much pain    How long can you walk comfortably? up hill causes pain    Diagnostic tests xrays    Patient Stated Goals get rid of pain and strengthening back muscles    Currently in Pain? Yes   Pain Score 3    Pain Location Back   Pain Orientation Right;Left;Lower   Pain Descriptors / Indicators Dull   Pain Type Chronic pain   Pain Radiating Towards down into the back of both legs Rt > Lt    Pain  Onset More than a month ago   Pain Frequency Intermittent   Aggravating Factors  worse in the moring - better as the day goes on; lifting - especially if leaning to the left and lifting or carrying    Pain Relieving Factors heat; ice; lying in the floor             [facility] PT Assessment - 07/05/16 0001      Assessment   Medical Diagnosis spondylosis lumbar spine    Referring Provider Dr. Benjamin Stain    Onset Date/Surgical Date 02/21/16   Hand Dominance Right   Next MD Visit 9/17   Prior Therapy chiropractic care many years ago      Precautions   Precautions None     Balance Screen   Has the patient fallen in the past 6 months No   Has the patient had a decrease in activity level because of a fear of falling?  No   Is the patient reluctant to leave their home because of a fear of falling?  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    Leisure yard ward; garden; Curatormechanic work; household chores/cooking      Observation/Other Assessments   Focus on Therapeutic Outcomes (FOTO)  34% limitation     Sensation   Additional Comments numbness in both feet comes and goes - more when sitting or driving resolves with change in positions      Posture/Postural Control   Posture Comments sits flexed forward; arms prooped on knees much of the time he is sitting; standing - head forward shoudlers rounded incresaed throacic kyphosis hips forward; knees hyperextended      AROM   Lumbar Flexion 100%   Lumbar Extension 30%  discomfort Rt > Lt Lb    Lumbar - Right Side Bend 70%   discomfort Rt LB   Lumbar - Left Side Bend 65%   Lumbar - Right Rotation 40%   Lumbar - Left Rotation 40%  discomfort Rt LB      Strength   Overall  Strength Comments 5/5 bilat LE's      Flexibility   Hamstrings Rt 70 deg Lt 75 deg   Quadriceps WNL's    ITB tight Rt > Lt    Piriformis WFL's      Palpation   Spinal mobility hypomoble L5/4/3 with CPA mobs    Palpation comment tight bilat lumbar paraspinals; QL; piriformis; hip abductors                    [facility] Adult PT Treatment/Exercise - 07/05/16 0001      Self-Care   Self-Care --  initiated back care education      Lumbar Exercises: Stretches   Passive Hamstring Stretch 2 reps;30 seconds   Press Ups --  2-3 sec x 5    ITB Stretch 2 reps;30 seconds   Piriformis Stretch 2 reps;30 seconds     Moist Heat Therapy   Number Minutes Moist Heat 20 Minutes   Moist Heat Location Lumbar Spine     Electrical Stimulation   Electrical Stimulation Location bilat lumbar and hips   Electrical Stimulation Action IFC   Electrical Stimulation Parameters to tolerance   Electrical Stimulation Goals Pain;Tone                PT Education - 07/05/16 [phone]    Education provided Yes   Education Details HEP; back care; TENS info   Person(s) Educated Patient   Methods Explanation;Demonstration;Tactile cues;Verbal cues;Handout   Comprehension Verbalized understanding;Returned demonstration;Verbal cues required;Tactile cues required             PT Long Term Goals - 07/05/16 1317      PT LONG TERM GOAL #1   Title Improve sititng and standing posture with patient to verbalize and demonstrate good body mechanics with sitting and standing 08/16/16   Time 6   Period Weeks   Status New     PT LONG TERM GOAL #2   Title Imrpove flexibility in bilat hamstrings to 80-85 deg bilat 08/16/16   Time 6   Period Weeks   Status New     PT LONG TERM GOAL #3   Title Patient to demo/verbalize understanding of proper transitional movements/transfers/lifting technique 08/16/16   Time 6   Period Weeks   Status New     PT LONG TERM GOAL #4   Title Independent in HEP 08/16/16  Time 6   Period Weeks   Status New     PT LONG TERM GOAL #5   Title Improve FOTO to </= 27% limitation 08/16/16   Time 6   Period Weeks   Status New               Plan - 07/05/16 1312    Clinical Impression Statement Patient presents with recurrent LBP with pain in the LB and into bilat hips and posterior thighs to calves, Rt > LT. He has poor posture and alignment; limited trunk and LE mobilty and ROM; poor core strength; pain on a daily basis; limited activity level due to pain.    Rehab Potential Good   PT Frequency 2x / week   PT Duration 6 weeks   PT Treatment/Interventions Patient/family education;ADLs/Self Care Home Management;Neuromuscular re-education;Cryotherapy;Electrical Stimulation;Iontophoresis 4mg /ml Dexamethasone;Moist Heat;Traction;Ultrasound;Manual techniques;Dry needling;Therapeutic activities;Therapeutic exercise   PT Next Visit Plan add 3 part core; progress LE - hamstring stretch; consider TDN to lumbar and hip musculature vs manual work; back Copy with Plan of Care Patient      Patient will benefit from skilled therapeutic intervention in order to improve the following deficits and impairments:  Postural dysfunction, Improper body mechanics, Pain, Decreased range of motion, Decreased mobility, Decreased activity tolerance  Visit Diagnosis: Bilateral sciatica - Plan: PT plan of care cert/re-cert  Other symptoms and signs involving the musculoskeletal system - Plan: PT plan of care cert/re-cert     Problem List Patient Active Problem List   Diagnosis Date Noted  . Spondylosis of lumbar region without myelopathy or radiculopathy 06/29/2016    Valena Ivanov Rober Minion, PT, MPH  07/05/2016, 1:28 PM  [facility] [address] [place], [place], [address] Phone: [phone]   Fax:  [phone]  Name: Armond Cuthrell MRN: [id number] Date of Birth: 1951/06/20

## 2016-07-09 ENCOUNTER — Ambulatory Visit (INDEPENDENT_AMBULATORY_CARE_PROVIDER_SITE_OTHER): Payer: Medicare HMO | Admitting: Physical Therapy

## 2016-07-09 DIAGNOSIS — R29898 Other symptoms and signs involving the musculoskeletal system: Secondary | ICD-10-CM | POA: Diagnosis not present

## 2016-07-09 DIAGNOSIS — M5432 Sciatica, left side: Secondary | ICD-10-CM

## 2016-07-09 DIAGNOSIS — M5431 Sciatica, right side: Secondary | ICD-10-CM

## 2016-07-09 NOTE — Patient Instructions (Signed)
  Abdominal Bracing With Pelvic Floor (Hook-Lying)   With neutral spine, tighten pelvic floor and abdominals. Hold 5-10 seconds. Repeat __10_ times. Do _several__ times a day. * engage when standing/ sitting.    [facility] [address] [place], [place] [address]  [phone] (office) [phone] (fax)

## 2016-07-09 NOTE — Therapy (Signed)
[facility] [place] [address] [place] [place], [place], [address] Phone: [phone]   Fax:  [phone]  Physical Therapy Treatment  Patient Details  Name: Mike BushmanCharlie Bauer MRN: [id number] Date of Birth: 11/08/1950 Referring Provider: Dr. Benjamin Stainhekkekandam   Encounter Date: 07/09/2016      PT End of Session - 07/09/16 0806    Visit Number 2   Number of Visits 12   Date for PT Re-Evaluation 08/16/16   PT Start Time 0802   PT Stop Time 0901   PT Time Calculation (min) 59 min   Activity Tolerance Patient tolerated treatment well      Past Medical History:  Diagnosis Date  . Hypertension     Past Surgical History:  Procedure Laterality Date  . EYE SURGERY Left     There were no vitals filed for this visit.      Subjective Assessment - 07/09/16 0807    Subjective Pt reports he has been very sore in his "butt cheeks and down his legs" since last visit.  He states he felt really good when last visit ended, but a few hours later he had increased soreness.  He has been performing stretches of HEP 1x/day.    Patient Stated Goals get rid of pain and strengthening back muscles    Currently in Pain? Yes   Pain Score 4    Pain Location Buttocks   Pain Orientation Right;Left   Pain Descriptors / Indicators Dull   Aggravating Factors  weed eating, first thing in morning.    Pain Relieving Factors walking, heat, ice             [facility] - 07/09/16 0001      Assessment   Medical Diagnosis spondylosis lumbar spine    Referring Provider Dr. Benjamin Stainhekkekandam    Onset Date/Surgical Date 02/21/16   Hand Dominance Right   Next MD Visit 9/17   Prior Therapy chiropractic care many years ago                      [facility]/Exercise - 07/09/16 0001      Self-Care   Self-Care Other Self-Care Comments   Other Self-Care Comments  Pt educated on supine to/from sit via log roll.  Pt returned demo.      Lumbar Exercises: Stretches   Passive Hamstring Stretch 4 reps;30 seconds   Passive Hamstring Stretch Limitations (seated with straight back; then supine with strap)   Press Ups --  2-3 sec x 5    ITB Stretch 2 reps;60 seconds   Piriformis Stretch 2 reps;60 seconds     Lumbar Exercises: Aerobic   Stationary Bike NuStep L4: 6 min      Lumbar Exercises: Seated   Sit to Stand 5 reps  with core engaged.      Lumbar Exercises: Supine   Ab Set 10 reps;5 seconds     Moist Heat Therapy   Number Minutes Moist Heat 15 Minutes   Moist Heat Location Lumbar Spine  and Rt hamstring     Electrical Stimulation   Electrical Stimulation Location bilat lumbar/ Rt glute + hamstring   Electrical Stimulation Action IFC   Electrical Stimulation Parameters to tolerance    Electrical Stimulation Goals Tone;Pain                     PT Long Term Goals - 07/09/16 1058      PT LONG TERM GOAL #  1   Title Improve sititng and standing posture with patient to verbalize and demonstrate good body mechanics with sitting and standing 08/16/16   Time 6   Period Weeks   Status On-going     PT LONG TERM GOAL #2   Title Imrpove flexibility in bilat hamstrings to 80-85 deg bilat 08/16/16   Time 6   Period Weeks   Status On-going     PT LONG TERM GOAL #3   Title Patient to demo/verbalize understanding of proper transitional movements/transfers/lifting technique 08/16/16   Time 6   Period Weeks   Status On-going     PT LONG TERM GOAL #4   Title Independent in HEP 08/16/16   Time 6   Period Weeks   Status On-going     PT LONG TERM GOAL #5   Title Improve FOTO to </= 27% limitation 08/16/16   Time 6   Period Weeks   Status On-going               Plan - 07/09/16 0847    Clinical Impression Statement Pt required some VC to decrease intensity of stretch to reduce pain/ tightness.  Pt required frequent cues for proper core engagement and posture.  Reviewed body mechanics for reducing  back pain with transitional movements and work.   Pt reported reduction of pain at end of session.    Rehab Potential Good   PT Frequency 2x / week   PT Duration 6 weeks   PT Treatment/Interventions Patient/family education;ADLs/Self Care Home Management;Neuromuscular re-education;Cryotherapy;Electrical Stimulation;Iontophoresis 4mg /ml Dexamethasone;Moist Heat;Traction;Ultrasound;Manual techniques;Dry needling;Therapeutic activities;Therapeutic exercise   PT Next Visit Plan TDN to lumbar and hip musculature vs manual work; back education. Review TA work and progress core strengthening.    Consulted and Agree with Plan of Care Patient      Patient will benefit from skilled therapeutic intervention in order to improve the following deficits and impairments:  Postural dysfunction, Improper body mechanics, Pain, Decreased range of motion, Decreased mobility, Decreased activity tolerance  Visit Diagnosis: Bilateral sciatica  Other symptoms and signs involving the musculoskeletal system     Problem List Patient Active Problem List   Diagnosis Date Noted  . Spondylosis of lumbar region without myelopathy or radiculopathy 06/29/2016   Mayer Camel, PTA 07/09/16 10:59 AM  [facility] [place] [address] [place] [address] [place], [place], [address] Phone: [phone]   Fax:  [phone]  Name: Mike Bauer MRN: [id number] Date of Birth: [birth year]

## 2016-07-13 ENCOUNTER — Ambulatory Visit (INDEPENDENT_AMBULATORY_CARE_PROVIDER_SITE_OTHER): Payer: Medicare HMO | Admitting: Physical Therapy

## 2016-07-13 DIAGNOSIS — M5432 Sciatica, left side: Secondary | ICD-10-CM

## 2016-07-13 DIAGNOSIS — R29898 Other symptoms and signs involving the musculoskeletal system: Secondary | ICD-10-CM | POA: Diagnosis not present

## 2016-07-13 DIAGNOSIS — M5431 Sciatica, right side: Secondary | ICD-10-CM

## 2016-07-13 NOTE — Therapy (Signed)
[facility] [place] [address] [place], [place], [address] Phone: [phone]   Fax:  [phone]  Physical Therapy Treatment  Patient Details  Name: Mike Bauer MRN: [id number] Date of Birth: [birth year] Referring Provider: Dr Greggory Brandy  Encounter Date: 07/13/2016      PT End of Session - 07/13/16 0805    Visit Number 3   Number of Visits 12   Date for PT Re-Evaluation 08/16/16   PT Start Time 0805   PT Stop Time 0903   PT Time Calculation (min) 58 min   Activity Tolerance Patient tolerated treatment well      Past Medical History:  Diagnosis Date  . Hypertension     Past Surgical History:  Procedure Laterality Date  . EYE SURGERY Left     There were no vitals filed for this visit.      Subjective Assessment - 07/13/16 0806    Subjective Mike Bauer reports that the pain has gotten worse in his Rt LE over the weekend.  ( He is limping today)    Patient Stated Goals get rid of pain and strengthening back muscles    Currently in Pain? Yes   Pain Score 5    Pain Location Back   Pain Orientation Right   Pain Descriptors / Indicators Throbbing;Tightness   Pain Type Chronic pain   Pain Radiating Towards calf Rt    Aggravating Factors  transitioning sit to stand   Pain Relieving Factors squating onto one knee            [facility] PT Assessment - 07/13/16 0001      Assessment   Medical Diagnosis spondylosis lumbar spine    Referring Provider Dr Greggory Brandy   Onset Date/Surgical Date 02/21/16   Hand Dominance Right   Next MD Visit 9/17     Flexibility   Hamstrings Rt 80, Lt 85                     [facility] Adult PT Treatment/Exercise - 07/13/16 0001      Exercises   Exercises Lumbar     Lumbar Exercises: Stretches   Single Knee to Chest Stretch 2 reps;20 seconds   Lower Trunk Rotation 5 reps;10 seconds   Press Ups 5 reps   Quadruped Mid Back Stretch 5 reps     Lumbar Exercises: Prone   Other Prone Lumbar Exercises POE, then press ups x5     Modalities   Modalities Electrical Stimulation;Moist Heat     Moist Heat Therapy   Number Minutes Moist Heat 15 Minutes   Moist Heat Location Lumbar Spine     Electrical Stimulation   Electrical Stimulation Location bilat lumbar   Electrical Stimulation Action IFC   Electrical Stimulation Parameters to tolerance   Electrical Stimulation Goals Tone;Pain     Manual Therapy   Manual Therapy Soft tissue mobilization   Soft tissue mobilization very tight in Rt lumbar paraspinals, some in the left. TPR and manual work to this area.           Trigger Point Dry Needling - 07/13/16 6962    Consent Given? Yes   Education Handout Provided Yes   Muscles Treated Upper Body Longissimus   Longissimus Response Twitch response elicited;Palpable increased muscle length  hooked up to stim.               PT Education - 07/13/16 0841    Education Details HEP TDN   Person(s) Educated Patient  Methods Handout;Explanation;Demonstration   Comprehension Verbalized understanding;Returned demonstration             PT Long Term Goals - 07/13/16 0810      PT LONG TERM GOAL #1   Title Improve sititng and standing posture with patient to verbalize and demonstrate good body mechanics with sitting and standing 08/16/16   Time 6   Period Weeks   Status On-going     PT LONG TERM GOAL #2   Title Imrpove flexibility in bilat hamstrings to 80-85 deg bilat 08/16/16   Time 6   Period Weeks   Status Achieved     PT LONG TERM GOAL #3   Title Patient to demo/verbalize understanding of proper transitional movements/transfers/lifting technique 08/16/16   Time 6   Period Weeks   Status On-going     PT LONG TERM GOAL #4   Title Independent in HEP 08/16/16   Time 6   Period Weeks   Status On-going     PT LONG TERM GOAL #5   Title Improve FOTO to </= 27% limitation 08/16/16   Time 6   Period Weeks   Status On-going                Plan - 07/13/16 0850    Clinical Impression Statement Mike Bauer has made progress with hamstring flexibility however still has a lot of tightness and cramping in the back of his LE's Rt > Lt.  His lumbar paraspinals are very tight. Did get some release after needling however still very tight   Rehab Potential Good   PT Frequency 2x / week   PT Duration 6 weeks   PT Treatment/Interventions Patient/family education;ADLs/Self Care Home Management;Neuromuscular re-education;Cryotherapy;Electrical Stimulation;Iontophoresis 4mg /ml Dexamethasone;Moist Heat;Traction;Ultrasound;Manual techniques;Dry needling;Therapeutic activities;Therapeutic exercise   PT Next Visit Plan TDN to lumbar and hip musculature vs manual work; back education. Might benefit from traction if still having symptoms in his leg   Consulted and Agree with Plan of Care Patient      Patient will benefit from skilled therapeutic intervention in order to improve the following deficits and impairments:  Postural dysfunction, Improper body mechanics, Pain, Decreased range of motion, Decreased mobility, Decreased activity tolerance  Visit Diagnosis: Other symptoms and signs involving the musculoskeletal system  Bilateral sciatica     Problem List Patient Active Problem List   Diagnosis Date Noted  . Spondylosis of lumbar region without myelopathy or radiculopathy 06/29/2016    Roderic ScarceSusan Shaver PT  07/13/2016, 9:34 AM  [facility] [address] [place], [place], [address] Phone: [phone]   Fax:  [phone]  Name: Mike Bauer MRN: [id number] Date of Birth: 12/28/1950

## 2016-07-13 NOTE — Patient Instructions (Signed)
On Elbows (Prone)    Rise up on elbows as high as possible, keeping hips on floor. Hold __60-120__ seconds. Repeat _1 time, 2 times a day  Extension    Lie face down, hands close to chest. Press trunk up, arching back. Keep neck long, shoulders down. Tighten buttocks to protect lower back. Hold _2-3__ seconds. Repeat __5__ times. Do __2__ sessions per day.  Trigger Point Dry Needling  . What is Trigger Point Dry Needling (DN)? o DN is a physical therapy technique used to treat muscle pain and dysfunction. Specifically, DN helps deactivate muscle trigger points (muscle knots).  o A thin filiform needle is used to penetrate the skin and stimulate the underlying trigger point. The goal is for a local twitch response (LTR) to occur and for the trigger point to relax. No medication of any kind is injected during the procedure.   . What Does Trigger Point Dry Needling Feel Like?  o The procedure feels different for each individual patient. Some patients report that they do not actually feel the needle enter the skin and overall the process is not painful. Very mild bleeding may occur. However, many patients feel a deep cramping in the muscle in which the needle was inserted. This is the local twitch response.   Marland Kitchen. How Will I feel after the treatment? o Soreness is normal, and the onset of soreness may not occur for a few hours. Typically this soreness does not last longer than two days.  o Bruising is uncommon, however; ice can be used to decrease any possible bruising.  o In rare cases feeling tired or nauseous after the treatment is normal. In addition, your symptoms may get worse before they get better, this period will typically not last longer than 24 hours.   . What Can I do After My Treatment? o Increase your hydration by drinking more water for the next 24 hours. o You may place ice or heat on the areas treated that have become sore, however, do not use heat on inflamed or bruised  areas. Heat often brings more relief post needling. o You can continue your regular activities, but vigorous activity is not recommended initially after the treatment for 24 hours. o DN is best combined with other physical therapy such as strengthening, stretching, and other therapies.

## 2016-07-19 ENCOUNTER — Encounter: Payer: Self-pay | Admitting: Rehabilitative and Restorative Service Providers"

## 2016-07-19 ENCOUNTER — Ambulatory Visit (INDEPENDENT_AMBULATORY_CARE_PROVIDER_SITE_OTHER): Payer: Medicare HMO | Admitting: Rehabilitative and Restorative Service Providers"

## 2016-07-19 DIAGNOSIS — R29898 Other symptoms and signs involving the musculoskeletal system: Secondary | ICD-10-CM

## 2016-07-19 DIAGNOSIS — M5431 Sciatica, right side: Secondary | ICD-10-CM | POA: Diagnosis not present

## 2016-07-19 DIAGNOSIS — M5432 Sciatica, left side: Secondary | ICD-10-CM | POA: Diagnosis not present

## 2016-07-19 NOTE — Patient Instructions (Signed)
Self massage using ~ 4 inch rubber ball  Prone press up - lying on stomach on firm flat surfaces Avoid bent forward  Hold on cat cow and full hamstring stretch for a couple of days  Try using the TENS unit for longer periods of time and putting in on before you start to have pain

## 2016-07-19 NOTE — Therapy (Signed)
[facility] [place] [address] [place], [place], [address] Phone: [phone]   Fax:  [phone]  Physical Therapy Treatment  Patient Details  Name: Mike Bauer MRN: [id number] Date of Birth: 01-22-1951 Referring Provider: Dr Greggory Brandy  Encounter Date: 07/19/2016      PT End of Session - 07/19/16 1009    Visit Number 4   Number of Visits 12   Date for PT Re-Evaluation 08/16/16   PT Start Time 0847   PT Stop Time 0948   PT Time Calculation (min) 61 min   Activity Tolerance Patient tolerated treatment well      Past Medical History:  Diagnosis Date  . Hypertension     Past Surgical History:  Procedure Laterality Date  . EYE SURGERY Left     There were no vitals filed for this visit.      Subjective Assessment - 07/19/16 0856    Subjective Continued pain in the Rt LE - mostly in his calf. Could not tell a difference in the pain with the TDn - may have helped a little while.  The cat cow and hamstring stretches above ~50 deg really hurt. Has his TENS unit now and has used it over the weekend - seems to help some but then after it seems like it taghtens up. Symptoms are always worse when he first gets up in the morning.    Currently in Pain? Yes   Pain Score 5    Pain Location Back   Pain Orientation Right   Pain Descriptors / Indicators Throbbing;Tightness   Pain Type Chronic pain   Pain Radiating Towards Rt calf   Pain Onset More than a month ago   Pain Frequency Intermittent                         [facility] Adult PT Treatment/Exercise - 07/19/16 0001      Therapeutic Activites    Therapeutic Activities --  myofacial ball release work Rt hip/buttock     Lumbar Exercises: Stretches   Passive Hamstring Stretch 3 reps;30 seconds   Passive Hamstring Stretch Limitations supine - keeping stretch below level of pain    Prone on Elbows Stretch --  2-3 min    Press Ups --  10 x 2-3 sec hold       Lumbar Exercises: Supine   Ab Set --  core 10 sec x 10     Moist Heat Therapy   Number Minutes Moist Heat 18 Minutes   Moist Heat Location Lumbar Spine     Electrical Stimulation   Electrical Stimulation Location bilat lumbar Rt buttock   Electrical Stimulation Action IFC   Electrical Stimulation Parameters to tolerance   Electrical Stimulation Goals Tone;Pain     Ultrasound   Ultrasound Location Rt piriformis/hip abductors    Ultrasound Parameters 100%; 1.5 w/cm2; 1 mHz; 8 min    Ultrasound Goals Pain;Other (Comment)  muscular tightness      Traction   Type of Traction Lumbar  pt wt - 132#   Min (lbs) 30   Max (lbs) 45   Hold Time 60 sec   Rest Time 10 sec    Time 15     Manual Therapy   Manual Therapy Soft tissue mobilization   Joint Mobilization CPA mobs Grade III lower lumbar    Soft tissue mobilization lumbar paraspinals; Rt hip abductors/rotators    Myofascial Release Rt hip abductors and rotators  PT Education - 07/19/16 0933    Education provided Yes   Education Details HEP   Person(s) Educated Patient   Methods Explanation;Demonstration;Tactile cues;Verbal cues;Handout   Comprehension Verbalized understanding;Returned demonstration;Verbal cues required;Tactile cues required             PT Long Term Goals - 07/19/16 1014      PT LONG TERM GOAL #1   Title Improve sititng and standing posture with patient to verbalize and demonstrate good body mechanics with sitting and standing 08/16/16   Time 6   Period Weeks   Status On-going     PT LONG TERM GOAL #2   Title Imrpove flexibility in bilat hamstrings to 80-85 deg bilat 08/16/16   Time 6   Period Weeks   Status On-going     PT LONG TERM GOAL #3   Title Patient to demo/verbalize understanding of proper transitional movements/transfers/lifting technique 08/16/16   Time 6   Period Weeks   Status On-going     PT LONG TERM GOAL #4   Title Independent in HEP 08/16/16    Time 6   Period Weeks   Status On-going     PT LONG TERM GOAL #5   Title Improve FOTO to </= 27% limitation 08/16/16   Time 6   Period Weeks   Status On-going               Plan - 07/19/16 1010    Clinical Impression Statement Continued radicular pain. Will hold cat cow and keep HS stretch below level of pain to avoid irritation of nerve for a trial until next visit. He will focus on trunk extension. Trial of US, lumbar mobs and deep tissue work today with pt to work on myofacial release to the Rt hip/buttock area and continue use of TENS - increasing the wearing time. Trial of lumbar traction. Continue assessment and modifying treatment as indicated.    Rehab Potential Good   PT Frequency 2x / week   PT Duration 6 weeks   PT Treatment/Interventions Patient/family education;ADLs/Self Care Home Management;Neuromuscular re-education;Cryotherapy;Electrical Stimulation;Iontophoresis 4mg /ml Dexamethasone;Moist Heat;Traction;Ultrasound;Manual techniques;Dry needling;Therapeutic activities;Therapeutic exercise   PT Next Visit Plan assess repsonse to US. lumbar mobs. STM lumbar spine and Rt hip/buttocks as well as lumbar traction.    Consulted and Agree with Plan of Care Patient      Patient will benefit from skilled therapeutic intervention in order to improve the following deficits and impairments:  Postural dysfunction, Improper body mechanics, Pain, Decreased range of motion, Decreased mobility, Decreased activity tolerance  Visit Diagnosis: Other symptoms and signs involving the musculoskeletal system  Bilateral sciatica     Problem List Patient Active Problem List   Diagnosis Date Noted  . Spondylosis of lumbar region without myelopathy or radiculopathy 06/29/2016    Julies Carmickle Rober MinionP Jabri Blancett PT, MPH  07/19/2016, 10:16 AM  [facility] [address] [place], [place], [address] Phone: [phone]   Fax:  [phone]  Name:  Kurtis BushmanCharlie Trigg MRN: [id number] Date of Birth: 04/06/1951

## 2016-07-23 ENCOUNTER — Ambulatory Visit (INDEPENDENT_AMBULATORY_CARE_PROVIDER_SITE_OTHER): Payer: Medicare HMO | Admitting: Physical Therapy

## 2016-07-23 DIAGNOSIS — M5432 Sciatica, left side: Secondary | ICD-10-CM | POA: Diagnosis not present

## 2016-07-23 DIAGNOSIS — M5431 Sciatica, right side: Secondary | ICD-10-CM

## 2016-07-23 DIAGNOSIS — R29898 Other symptoms and signs involving the musculoskeletal system: Secondary | ICD-10-CM | POA: Diagnosis not present

## 2016-07-23 NOTE — Therapy (Signed)
[facility] [place] [address] [place], [place], [address] Phone: [phone]   Fax:  [phone]  Physical Therapy Treatment  Patient Details  Name: Mike BushmanCharlie Bauer MRN: [id number] Date of Birth: 10/02/1951 Referring Provider: Dr. Linus Makohekkandem  Encounter Date: 07/23/2016      PT End of Session - 07/23/16 0853    Visit Number 5   Number of Visits 12   Date for PT Re-Evaluation 08/16/16   PT Start Time 0802   PT Stop Time 0906   PT Time Calculation (min) 64 min      Past Medical History:  Diagnosis Date  . Hypertension     Past Surgical History:  Procedure Laterality Date  . EYE SURGERY Left     There were no vitals filed for this visit.      Subjective Assessment - 07/23/16 0806    Subjective Pt reports he had good relief with last treatment. Pain has been less.   Currently in Pain? Yes   Pain Score 2    Pain Location Back   Pain Orientation Right;Left  R>L   Pain Descriptors / Indicators Sharp;Stabbing   Aggravating Factors  transitioning sit to stand, unlevel hips on stairs, twisting back   Pain Relieving Factors press ups.             [facility] - 07/23/16 0001      Assessment   Medical Diagnosis spondylosis lumbar spine    Referring Provider Dr. Linus Makohekkandem   Onset Date/Surgical Date 02/21/16   Hand Dominance Right   Next MD Visit hasn't scheduled yet.           [facility] Adult PT Treatment/Exercise - 07/23/16 0001      Lumbar Exercises: Stretches   Passive Hamstring Stretch 3 reps;30 seconds  ~70 deg with Rt.    Passive Hamstring Stretch Limitations supine - keeping stretch below level of pain    Prone on Elbows Stretch --  10 reps, 5 sec hold.    Piriformis Stretch 2 reps;30 seconds     Lumbar Exercises: Aerobic   Stationary Bike NuStep L5: 7 min      Lumbar Exercises: Prone   Straight Leg Raise 1 second;10 reps  VC to tighten core   Opposite Arm/Leg Raise Right arm/Left  leg;Left arm/Right leg;5 reps     Moist Heat Therapy   Number Minutes Moist Heat 15 Minutes   Moist Heat Location Lumbar Spine     Electrical Stimulation   Electrical Stimulation Location bilat lumbar paraspinals / bilat SI joint   Electrical Stimulation Action IFC   Electrical Stimulation Parameters to tolerance    Electrical Stimulation Goals Tone;Pain     Ultrasound   Ultrasound Location Rt piriformis    Ultrasound Parameters 100%, 1.5 w/cm2, 8 min    Ultrasound Goals Pain;Other (Comment)  muscular tightness      Traction   Type of Traction Lumbar  pt wt - 132#   Min (lbs) 35   Max (lbs) 50   Hold Time 60 sec   Rest Time 20   Time 15                     PT Long Term Goals - 07/19/16 1014      PT LONG TERM GOAL #1   Title Improve sititng and standing posture with patient to verbalize and demonstrate good body mechanics with sitting and standing 08/16/16   Time 6   Period Weeks  Status On-going     PT LONG TERM GOAL #2   Title Imrpove flexibility in bilat hamstrings to 80-85 deg bilat 08/16/16   Time 6   Period Weeks   Status On-going     PT LONG TERM GOAL #3   Title Patient to demo/verbalize understanding of proper transitional movements/transfers/lifting technique 08/16/16   Time 6   Period Weeks   Status On-going     PT LONG TERM GOAL #4   Title Independent in HEP 08/16/16   Time 6   Period Weeks   Status On-going     PT LONG TERM GOAL #5   Title Improve FOTO to </= 27% limitation 08/16/16   Time 6   Period Weeks   Status On-going               Plan - 07/23/16 1058    Clinical Impression Statement Pt tolerated all exercises well, without increase in radicular pain. Pt reported decrease in pain after traction with estim/ MHP.  Continued improvement and progress towards goals today.    Rehab Potential Good   PT Frequency 2x / week   PT Duration 6 weeks   PT Treatment/Interventions Patient/family education;ADLs/Self Care Home  Management;Neuromuscular re-education;Cryotherapy;Electrical Stimulation;Iontophoresis 4mg /ml Dexamethasone;Moist Heat;Traction;Ultrasound;Manual techniques;Dry needling;Therapeutic activities;Therapeutic exercise   PT Next Visit Plan Continue [place] and traction, and progressive lumbar stabilization.    Consulted and Agree with Plan of Care Patient      Patient will benefit from skilled therapeutic intervention in order to improve the following deficits and impairments:  Postural dysfunction, Improper body mechanics, Pain, Decreased range of motion, Decreased mobility, Decreased activity tolerance  Visit Diagnosis: Other symptoms and signs involving the musculoskeletal system  Bilateral sciatica     Problem List Patient Active Problem List   Diagnosis Date Noted  . Spondylosis of lumbar region without myelopathy or radiculopathy 06/29/2016    Salvadore Oxford 07/23/2016, 11:01 AM  [facility] [address] [place], [place], [address] Phone: [phone]   Fax:  [phone]  Name: Mike Bauer MRN: [id number] Date of Birth: Jul 27, 1951

## 2016-07-26 ENCOUNTER — Encounter: Payer: Medicare HMO | Admitting: Rehabilitative and Restorative Service Providers"

## 2016-07-27 ENCOUNTER — Ambulatory Visit (INDEPENDENT_AMBULATORY_CARE_PROVIDER_SITE_OTHER): Payer: Medicare HMO | Admitting: Physical Therapy

## 2016-07-27 DIAGNOSIS — M5432 Sciatica, left side: Secondary | ICD-10-CM | POA: Diagnosis not present

## 2016-07-27 DIAGNOSIS — R29898 Other symptoms and signs involving the musculoskeletal system: Secondary | ICD-10-CM | POA: Diagnosis not present

## 2016-07-27 DIAGNOSIS — M5431 Sciatica, right side: Secondary | ICD-10-CM

## 2016-07-27 NOTE — Therapy (Signed)
[facility] [address] [place], [place], [address] Phone: [phone]   Fax:  [phone]  Physical Therapy Treatment  Patient Details  Name: Mike BushmanCharlie Bauer MRN: [id number] Date of Birth: 01/28/1951 Referring Provider: Dr. Briant Siteshekkekandem  Encounter Date: 07/27/2016      PT End of Session - 07/27/16 0857    Visit Number 6   Number of Visits 12   Date for PT Re-Evaluation 08/16/16   PT Start Time 0850   PT Stop Time 0950   PT Time Calculation (min) 60 min      Past Medical History:  Diagnosis Date  . Hypertension     Past Surgical History:  Procedure Laterality Date  . EYE SURGERY Left     There were no vitals filed for this visit.      Subjective Assessment - 07/27/16 0857    Subjective Pt reports he has used his inversion table for 8-10 min, twice.  Pain is now more in back, getting to be less in calf and back of leg.   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 Yes   Pain Score 3    Pain Location Back   Pain Orientation Right;Left  Rt>Lt   Pain Descriptors / Indicators Sharp;Tightness   Aggravating Factors  transitioning sit to stand, twisting back , lifting    Pain Relieving Factors TENS            [facility] PT Assessment - 07/27/16 0001      Assessment   Medical Diagnosis spondylosis lumbar spine    Referring Provider Dr. Briant Siteshekkekandem   Onset Date/Surgical Date 02/21/16   Hand Dominance Right   Next MD Visit hasn't scheduled yet.      Flexibility   Hamstrings ~80 bilat          [facility] Adult PT Treatment/Exercise - 07/27/16 0001      Lumbar Exercises: Stretches   Passive Hamstring Stretch 2 reps;60 seconds  supine with strap   Standing Extension 5 reps;10 seconds   Piriformis Stretch 2 reps;60 seconds  sitting, VC for posture.      Lumbar Exercises: Aerobic   Stationary Bike NuStep L5: 5 min      Lumbar Exercises: Standing   Other Standing Lumbar Exercises sit to/from stand with various feet positions and core engaged x 12 reps      Lumbar Exercises: Prone   Single Arm Raise 5 reps;Right   Straight Leg Raise 1 second;10 reps  VC to tighten core     Moist Heat Therapy   Number Minutes Moist Heat 15 Minutes   Moist Heat Location Lumbar Spine     Electrical Stimulation   Electrical Stimulation Location bilat lumbar paraspinals / bilat SI joint   Electrical Stimulation Action pre-mod to each area   Electrical Stimulation Parameters to tolerance    Electrical Stimulation Goals Tone;Pain     Traction   Min (lbs) 35   Max (lbs) 50   Hold Time 60   Rest Time 20   Time 15     Manual Therapy   Myofascial Release Rt iliopsoas in hooklying.            PT Long Term Goals - 07/19/16 1014      PT LONG TERM GOAL #1   Title Improve sititng and standing posture with patient to verbalize and demonstrate good body mechanics with sitting and standing 08/16/16  Time 6   Period Weeks   Status On-going     PT LONG TERM GOAL #2   Title Imrpove flexibility in bilat hamstrings to 80-85 deg bilat 08/16/16   Time 6   Period Weeks   Status On-going     PT LONG TERM GOAL #3   Title Patient to demo/verbalize understanding of proper transitional movements/transfers/lifting technique 08/16/16   Time 6   Period Weeks   Status On-going     PT LONG TERM GOAL #4   Title Independent in HEP 08/16/16   Time 6   Period Weeks   Status On-going     PT LONG TERM GOAL #5   Title Improve FOTO to </= 27% limitation 08/16/16   Time 6   Period Weeks   Status On-going               Plan - 07/27/16 1238    Clinical Impression Statement Pt tolerated all exercises except sit to/from stand with core engaged.  Pt reported increased tightness in Rt glute/hamstring.  No increase in pain during treatment.  Hamstring flexibility improving.  Pt making gradual progress towards goals with overall reduction in pain.   Pt requires  continue education of body mechanics and activities to avoid.     Rehab Potential Good   PT Frequency 2x / week   PT Duration 6 weeks   PT Treatment/Interventions Patient/family education;ADLs/Self Care Home Management;Neuromuscular re-education;Cryotherapy;Electrical Stimulation;Iontophoresis 4mg /ml Dexamethasone;Moist Heat;Traction;Ultrasound;Manual techniques;Dry needling;Therapeutic activities;Therapeutic exercise   PT Next Visit Plan Continue [place] and traction, and progressive lumbar stabilization.    Consulted and Agree with Plan of Care Patient      Patient will benefit from skilled therapeutic intervention in order to improve the following deficits and impairments:  Postural dysfunction, Improper body mechanics, Pain, Decreased range of motion, Decreased mobility, Decreased activity tolerance  Visit Diagnosis: Other symptoms and signs involving the musculoskeletal system  Bilateral sciatica     Problem List Patient Active Problem List   Diagnosis Date Noted  . Spondylosis of lumbar region without myelopathy or radiculopathy 06/29/2016   Mayer Camel, PTA 07/27/16 12:46 PM  [facility] [place] [address] [place], [place], [address] Phone: [phone]   Fax:  [phone]  Name: Mike Bauer MRN: [id number] Date of Birth: 09-28-1951

## 2016-07-30 ENCOUNTER — Ambulatory Visit (INDEPENDENT_AMBULATORY_CARE_PROVIDER_SITE_OTHER): Payer: Medicare HMO | Admitting: Physical Therapy

## 2016-07-30 DIAGNOSIS — M5432 Sciatica, left side: Secondary | ICD-10-CM | POA: Diagnosis not present

## 2016-07-30 DIAGNOSIS — M5431 Sciatica, right side: Secondary | ICD-10-CM

## 2016-07-30 DIAGNOSIS — R29898 Other symptoms and signs involving the musculoskeletal system: Secondary | ICD-10-CM

## 2016-07-30 NOTE — Therapy (Signed)
[place], [place], [address] Phone: [phone]   Fax:  [phone]  Physical Therapy Treatment  Patient Details  Name: Mike Bauer MRN: [id number] Date of Birth: [birth year] Referring Provider: Dr. Helane Rima  Encounter Date: 07/30/2016      PT End of Session - 07/30/16 0815    Visit Number 7   Number of Visits 12   Date for PT Re-Evaluation 08/16/16   PT Start Time 0802   PT Stop Time 0909   PT Time Calculation (min) 67 min   Activity Tolerance Patient tolerated treatment well;No increased pain      Past Medical History:  Diagnosis Date  . Hypertension     Past Surgical History:  Procedure Laterality Date  . EYE SURGERY Left     There were no vitals filed for this visit.      Subjective Assessment - 07/30/16 0815    Subjective Pt reports the pain is now more in his low back than in his legs. Legs now feel like 20# weights instead of 100# weights.     Patient Stated Goals get rid of pain and strengthening back muscles    Currently in Pain? Yes   Pain Score 3    Pain Location Back   Pain Orientation Right;Left  Rt>Lt   Pain Descriptors / Indicators Tightness;Sharp;Heaviness   Pain Radiating Towards Rt hamstring   Aggravating Factors  transitioning sit to stand, twisting back, lifting    Pain Relieving Factors TENS            [facility] PT Assessment - 07/30/16 0001      Assessment   Medical Diagnosis spondylosis lumbar spine    Referring Provider Dr. Helane Rima   Onset Date/Surgical Date 02/21/16   Hand Dominance Right     Flexibility   Hamstrings Rt 95 deg, Lt 92 deg.            [facility] Adult PT Treatment/Exercise - 07/30/16 0001      Self-Care   Other Self-Care Comments  Pt reminded to engage abdominals with transitional movements.      Exercises   Exercises Knee/Hip;Lumbar     Lumbar Exercises: Stretches   Passive Hamstring Stretch 3 reps  45 sec,  supine with strap   ITB Stretch 2 reps;60 seconds  supine with strap,    Piriformis Stretch 2 reps     Lumbar Exercises: Aerobic   Stationary Bike NuStep L5: 5 min      Lumbar Exercises: Supine   Ab Set 5 reps;5 seconds     Knee/Hip Exercises: Seated   Other Seated Knee/Hip Exercises Seated on dynadisc:  pelvic tilts ant/post and side to side x 5 slow reps in each direction.  Pt reported increased LBP/ leg pain with ant tilt as well as side tilting.      Moist Heat Therapy   Number Minutes Moist Heat 15 Minutes   Moist Heat Location Lumbar Spine     Electrical Stimulation   Electrical Stimulation Location bilat lumbar paraspinals / bilat SI joint   Electrical Stimulation Action IFC   Electrical Stimulation Parameters to tolerance    Electrical Stimulation Goals Pain     Ultrasound   Ultrasound Location Rt piriformis, Rt lumbar paraspinals   Ultrasound Parameters 100%, 1.2 w/cm2, 8 min total   Ultrasound Goals Pain;Other (Comment)  tightness     Traction   Min (lbs) 40   Max (lbs) 55   Hold Time 60  Rest Time 20   Time 17                     PT Long Term Goals - 07/30/16 0819      PT LONG TERM GOAL #1   Title Improve sitting and standing posture with patient to verbalize and demonstrate good body mechanics with sitting and standing 08/16/16   Time 6   Period Weeks   Status On-going     PT LONG TERM GOAL #2   Title Imrpove flexibility in bilat hamstrings to 80-85 deg bilat 08/16/16   Time 6   Period Weeks   Status Achieved     PT LONG TERM GOAL #3   Title Patient to demo/verbalize understanding of proper transitional movements/transfers/lifting technique 08/16/16   Time 6   Period Weeks   Status On-going     PT LONG TERM GOAL #4   Title Independent in HEP 08/16/16   Time 6   Period Weeks   Status On-going     PT LONG TERM GOAL #5   Title Improve FOTO to </= 27% limitation 08/16/16   Time 6   Period Weeks   Status On-going                Plan - 07/30/16 0919    Clinical Impression Statement Pt demonstrated improved flexibility in hamstrings; has met LTG #2.  Pt has difficulty tolerating increased lumbar extension, in sitting or prone; states this increases the radicular symptoms in legs.   Pt reported decreased symptoms in LB and legs at end of treatment.    Rehab Potential Good   PT Frequency 2x / week   PT Duration 6 weeks   PT Treatment/Interventions Patient/family education;ADLs/Self Care Home Management;Neuromuscular re-education;Cryotherapy;Electrical Stimulation;Iontophoresis 71m/ml Dexamethasone;Moist Heat;Traction;Ultrasound;Manual techniques;Dry needling;Therapeutic activities;Therapeutic exercise   PT Next Visit Plan Continue [place] traction, and progressive lumbar stabilization.    Consulted and Agree with Plan of Care Patient      Patient will benefit from skilled therapeutic intervention in order to improve the following deficits and impairments:  Postural dysfunction, Improper body mechanics, Pain, Decreased range of motion, Decreased mobility, Decreased activity tolerance  Visit Diagnosis: Other symptoms and signs involving the musculoskeletal system  Bilateral sciatica     Problem List Patient Active Problem List   Diagnosis Date Noted  . Spondylosis of lumbar region without myelopathy or radiculopathy 06/29/2016   JKerin Perna PTA 07/30/16 9:32 AM  [place] [address]Phone: [phone]  Fax:  [phone] Name: Mike CremerMRN: [id number] of Birth: [birth year]

## 2016-08-06 ENCOUNTER — Ambulatory Visit (INDEPENDENT_AMBULATORY_CARE_PROVIDER_SITE_OTHER): Payer: Medicare HMO | Admitting: Physical Therapy

## 2016-08-06 DIAGNOSIS — R29898 Other symptoms and signs involving the musculoskeletal system: Secondary | ICD-10-CM | POA: Diagnosis not present

## 2016-08-06 DIAGNOSIS — M5431 Sciatica, right side: Secondary | ICD-10-CM

## 2016-08-06 DIAGNOSIS — M5432 Sciatica, left side: Secondary | ICD-10-CM

## 2016-08-06 NOTE — Therapy (Signed)
[facility] [address] [place], [place], [address] Phone: [phone]   Fax:  [phone]  Physical Therapy Treatment  Patient Details  Name: Mike BushmanCharlie Bauer MRN: [id number] Date of Birth: 12/26/1950 Referring Provider: Dr. Briant Siteshekkekandem  Encounter Date: 08/06/2016      PT End of Session - 08/06/16 0857    Visit Number 8   Number of Visits 12   Date for PT Re-Evaluation 08/16/16   PT Start Time 0851  pt arrived late   PT Stop Time 0952   PT Time Calculation (min) 61 min      Past Medical History:  Diagnosis Date  . Hypertension     Past Surgical History:  Procedure Laterality Date  . EYE SURGERY Left     There were no vitals filed for this visit.      Subjective Assessment - 08/06/16 0858    Subjective Pt reports his legs don't feel as heavy.  He still has pain in thighs and Rt back, but it is decreasing each week.  He reports he has been consistent with stretches and has tried to minimize agrivating factors.    Patient Stated Goals get rid of pain and strengthening back muscles    Currently in Pain? Yes   Pain Score 3    Pain Location Back   Pain Orientation Right   Pain Descriptors / Indicators Sharp;Tightness;Heaviness   Pain Radiating Towards back of both legs to knees    Aggravating Factors  twisting back, lifting   Pain Relieving Factors TENS            [facility] PT Assessment - 08/06/16 0001      Assessment   Medical Diagnosis spondylosis lumbar spine    Referring Provider Dr. Briant Siteshekkekandem   Onset Date/Surgical Date 02/21/16   Hand Dominance Right   Next MD Visit hasn't scheduled yet.            [facility] Adult PT Treatment/Exercise - 08/06/16 0001      Lumbar Exercises: Stretches   Passive Hamstring Stretch 3 reps  45 sec, supine with strap   ITB Stretch 2 reps;60 seconds  supine with strap,    Piriformis Stretch 2 reps     Lumbar Exercises: Aerobic   Stationary Bike NuStep L5: 6  min      Lumbar Exercises: Supine   Clam 10 reps  with ab set   Bent Knee Raise 15 reps  with ab set   Bridge 10 reps;3 seconds     Knee/Hip Exercises: Stretches   Theme park managerGastroc Stretch Right;Left;3 reps;30 seconds     Ultrasound   Ultrasound Location Rt piriformis and lumbar paraspinals.    Ultrasound Parameters 100%, 1.2 w/cm2, 8 min total    Ultrasound Goals Pain;Other (Comment)  tightness     Traction   Min (lbs) 40   Max (lbs) 55   Hold Time 60   Rest Time 20   Time 17            PT Long Term Goals - 08/06/16 1300      PT LONG TERM GOAL #1   Title Improve sitting and standing posture with patient to verbalize and demonstrate good body mechanics with sitting and standing 08/16/16   Time 6   Period Weeks   Status On-going     PT LONG TERM GOAL #2   Title Imrpove flexibility in bilat hamstrings to 80-85 deg bilat 08/16/16   Time 6   Period Weeks  Status Achieved     PT LONG TERM GOAL #3   Title Patient to demo/verbalize understanding of proper transitional movements/transfers/lifting technique 08/16/16   Time 6   Period Weeks   Status On-going     PT LONG TERM GOAL #4   Title Independent in HEP 08/16/16   Time 6   Period Weeks   Status On-going     PT LONG TERM GOAL #5   Title Improve FOTO to </= 27% limitation 08/16/16   Time 6   Period Weeks   Status On-going               Plan - 08/06/16 1302    Clinical Impression Statement Pt reporting centralization of symptoms and 50% reduction in pain with sit to stand.  Pt tolerated all exercises well and reported decrease in pain in low back and legs at end of session .    Rehab Potential Good   PT Frequency 2x / week   PT Duration 6 weeks   PT Treatment/Interventions Patient/family education;ADLs/Self Care Home Management;Neuromuscular re-education;Cryotherapy;Electrical Stimulation;Iontophoresis 4mg /ml Dexamethasone;Moist Heat;Traction;Ultrasound;Manual techniques;Dry needling;Therapeutic  activities;Therapeutic exercise   PT Next Visit Plan Continue [place] and traction, and progressive lumbar stabilization.    Consulted and Agree with Plan of Care Patient      Patient will benefit from skilled therapeutic intervention in order to improve the following deficits and impairments:  Postural dysfunction, Improper body mechanics, Pain, Decreased range of motion, Decreased mobility, Decreased activity tolerance  Visit Diagnosis: Other symptoms and signs involving the musculoskeletal system  Bilateral sciatica     Problem List Patient Active Problem List   Diagnosis Date Noted  . Spondylosis of lumbar region without myelopathy or radiculopathy 06/29/2016   Mayer Camel, PTA 08/06/16 1:04 PM  [facility] [place] [address] [place], [place], [address] Phone: [phone]   Fax:  [phone]  Name: Mike Bauer MRN: [id number] Date of Birth: 06-20-1951

## 2016-08-09 ENCOUNTER — Ambulatory Visit (INDEPENDENT_AMBULATORY_CARE_PROVIDER_SITE_OTHER): Payer: Medicare HMO | Admitting: Physical Therapy

## 2016-08-09 DIAGNOSIS — R29898 Other symptoms and signs involving the musculoskeletal system: Secondary | ICD-10-CM | POA: Diagnosis not present

## 2016-08-09 DIAGNOSIS — M5432 Sciatica, left side: Secondary | ICD-10-CM

## 2016-08-09 DIAGNOSIS — M5431 Sciatica, right side: Secondary | ICD-10-CM | POA: Diagnosis not present

## 2016-08-09 NOTE — Therapy (Signed)
[facility] [place] [address] [place], [place], [address] Phone: [phone]   Fax:  [phone]  Physical Therapy Treatment  Patient Details  Name: Mike Bauer MRN: [id number] Date of Birth: March 01, 1951 Referring Provider: Dr. Briant Sites  Encounter Date: 08/09/2016      PT End of Session - 08/09/16 0852    Visit Number 9   Number of Visits 12   Date for PT Re-Evaluation 08/16/16   PT Start Time 0800   PT Stop Time 0909   PT Time Calculation (min) 69 min      Past Medical History:  Diagnosis Date  . Hypertension     Past Surgical History:  Procedure Laterality Date  . EYE SURGERY Left     There were no vitals filed for this visit.      Subjective Assessment - 08/09/16 0808    Subjective "I'm feeling even better today".  Pt reports he is having an easier time getting up from sitting and pain is now more localized to low back.     Currently in Pain? Yes   Pain Score 2    Pain Location Back   Pain Orientation Right   Pain Descriptors / Indicators Sore   Aggravating Factors  twisting back, lifting   Pain Relieving Factors TENS.              [facility] PT Assessment - 08/09/16 0001      Assessment   Medical Diagnosis spondylosis lumbar spine    Referring Provider Dr. Briant Sites   Onset Date/Surgical Date 02/21/16   Hand Dominance Right   Next MD Visit hasn't scheduled yet.             [facility] Adult PT Treatment/Exercise - 08/09/16 0001      Lumbar Exercises: Stretches   Passive Hamstring Stretch 3 reps  45 sec, supine with strap   ITB Stretch 3 reps;30 seconds   Piriformis Stretch 2 reps     Lumbar Exercises: Aerobic   Stationary Bike NuStep L5: 6 min      Lumbar Exercises: Supine   Bent Knee Raise 20 reps;1 second  with ab set   Bridge 10 reps;5 seconds     Lumbar Exercises: Sidelying   Clam 10 reps  each side     Lumbar Exercises: Prone   Opposite Arm/Leg Raise Right arm/Left  leg;Left arm/Right leg;5 reps;1 second  with ab set, 2 sets     Knee/Hip Exercises: Stretches   Gastroc Stretch Right;Left;3 reps;30 seconds     Programme researcher, broadcasting/film/video Location bilat lumbar paraspinals / bilat SI joint   Electrical Stimulation Action IFC   Electrical Stimulation Parameters to tolerance   Electrical Stimulation Goals Pain     Ultrasound   Ultrasound Location Rt piriformis and Rt lumbar paraspinals.    Ultrasound Parameters 100%, 1.2 w/cm2, 8 min    Ultrasound Goals Pain  tightness.      Traction   Min (lbs) 40   Max (lbs) 55   Hold Time 60   Rest Time 20   Time 20            PT Long Term Goals - 08/06/16 1300      PT LONG TERM GOAL #1   Title Improve sitting and standing posture with patient to verbalize and demonstrate good body mechanics with sitting and standing 08/16/16   Time 6   Period Weeks   Status On-going     PT LONG  TERM GOAL #2   Title Imrpove flexibility in bilat hamstrings to 80-85 deg bilat 08/16/16   Time 6   Period Weeks   Status Achieved     PT LONG TERM GOAL #3   Title Patient to demo/verbalize understanding of proper transitional movements/transfers/lifting technique 08/16/16   Time 6   Period Weeks   Status On-going     PT LONG TERM GOAL #4   Title Independent in HEP 08/16/16   Time 6   Period Weeks   Status On-going     PT LONG TERM GOAL #5   Title Improve FOTO to </= 27% limitation 08/16/16   Time 6   Period Weeks   Status On-going          Plan - 08/09/16 [id number]    Clinical Impression Statement Pt's back pain is centralizing and reducing with each visit. Pt tolerated all exercises with minimal to no increase in pain.  Pt progressing towards unmet goals.    Rehab Potential Good   PT Frequency 2x / week   PT Duration 6 weeks   PT Treatment/Interventions Patient/family education;ADLs/Self Care Home Management;Neuromuscular re-education;Cryotherapy;Electrical Stimulation;Iontophoresis 4mg /ml  Dexamethasone;Moist Heat;Traction;Ultrasound;Manual techniques;Dry needling;Therapeutic activities;Therapeutic exercise   PT Next Visit Plan Continue US and traction, and progressive lumbar stabilization. Add simulated (work-related) lifting exercises with core engagement.    Consulted and Agree with Plan of Care Patient      Patient will benefit from skilled therapeutic intervention in order to improve the following deficits and impairments:  Postural dysfunction, Improper body mechanics, Pain, Decreased range of motion, Decreased mobility, Decreased activity tolerance  Visit Diagnosis: Other symptoms and signs involving the musculoskeletal system  Bilateral sciatica     Problem List Patient Active Problem List   Diagnosis Date Noted  . Spondylosis of lumbar region without myelopathy or radiculopathy 06/29/2016   Mayer CamelJennifer Carlson-Long, PTA 08/09/16 11:42 AM  [facility] [address] [place], [place], [address] Phone: [phone]   Fax:  [phone]  Name: Mike BushmanCharlie Bauer MRN: [id number] Date of Birth: 08/18/1951

## 2016-08-13 ENCOUNTER — Ambulatory Visit (INDEPENDENT_AMBULATORY_CARE_PROVIDER_SITE_OTHER): Payer: Medicare HMO | Admitting: Physical Therapy

## 2016-08-13 DIAGNOSIS — R29898 Other symptoms and signs involving the musculoskeletal system: Secondary | ICD-10-CM

## 2016-08-13 DIAGNOSIS — M5431 Sciatica, right side: Secondary | ICD-10-CM

## 2016-08-13 DIAGNOSIS — M5432 Sciatica, left side: Secondary | ICD-10-CM | POA: Diagnosis not present

## 2016-08-13 NOTE — Patient Instructions (Addendum)
Arm / Leg Lift: Opposite (Prone)    Lift right leg and opposite arm _2___ inches from floor, keeping knee locked. Tighten stomach before lifting. (Don't lift leg too high) Repeat __10__ times per set. Do __1-2__ sets per session. Do __3__ sessions per week. Mike Bauer.   [facility] [place] [address] [place], [place] [address]  [phone] (office) [phone] (fax)

## 2016-08-13 NOTE — Therapy (Signed)
[facility] [place] [address] [place], [place], [address] Phone: [phone]   Fax:  [phone]  Physical Therapy Treatment  Patient Details  Name: Mike Bauer MRN: [id number] Date of Birth: [birth year] Referring Provider: Dr. Briant Sites  Encounter Date: 08/13/2016      PT End of Session - 08/13/16 0808    Visit Number 10   Number of Visits 12   Date for PT Re-Evaluation 08/16/16   PT Start Time 0755   PT Stop Time 0908   PT Time Calculation (min) 73 min   Activity Tolerance Patient tolerated treatment well;No increased pain   Behavior During Therapy WFL for tasks assessed/performed      Past Medical History:  Diagnosis Date  . Hypertension     Past Surgical History:  Procedure Laterality Date  . EYE SURGERY Left     There were no vitals filed for this visit.      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  He had some increased pain after weedeating and leafblowing, but he modified the way he was working and the pain wasn't as bad as in the past.   Pain continues to be less in legs, more just across low back now.    Currently in Pain? Yes   Pain Score 1    Pain Location Back   Pain Descriptors / Indicators Sore;Tingling            [facility] PT Assessment - 08/13/16 0001      Assessment   Medical Diagnosis spondylosis lumbar spine    Referring Provider Dr. Briant Sites   Onset Date/Surgical Date 02/21/16   Hand Dominance Right   Next MD Visit hasn't scheduled yet.            [facility] Adult PT Treatment/Exercise - 08/13/16 0001      Lumbar Exercises: Stretches   Passive Hamstring Stretch 3 reps  45 sec, supine with strap   ITB Stretch 3 reps;30 seconds   Piriformis Stretch 2 reps;30 seconds  seated     Lumbar Exercises: Aerobic   Stationary Bike NuStep L5: 7 min      Lumbar Exercises: Standing   Other Standing Lumbar Exercises mini  squat with back against wall, 3# wt from chest (held with both hands) then held out in front x 10 reps - held 5 secs   Other Standing Lumbar Exercises Simulated weed eating with quad cane, with core engaged - VC to move feet instead of twisting in back.       Lumbar Exercises: Sidelying   Clam 15 reps  eccentric lowering      Lumbar Exercises: Prone   Single Arm Raise Right;Left;10 reps;2 seconds     Lumbar Exercises: Quadruped   Madcat/Old Horse 5 reps     Modalities   Modalities Traction;Electrical Stimulation;Moist Heat     Moist Heat Therapy   Number Minutes Moist Heat 20 Minutes   Moist Heat Location Lumbar Spine     Electrical Stimulation   Electrical Stimulation Location bilat lumbar paraspinals/ SI joint   Electrical Stimulation Action IFC   Electrical Stimulation Parameters  to tolerance    Electrical Stimulation Goals Pain     Traction   Type of Traction Lumbar   Min (lbs) 40   Max (lbs) 55   Hold Time 60   Rest Time 20   Time 20  PT Education - 08/13/16 0904    Education provided Yes   Education Details HEP    Person(s) Educated Patient   Methods Handout;Demonstration;Explanation   Comprehension Verbalized understanding;Returned demonstration             PT Long Term Goals - 08/06/16 1300      PT LONG TERM GOAL #1   Title Improve sitting and standing posture with patient to verbalize and demonstrate good body mechanics with sitting and standing 08/16/16   Time 6   Period Weeks   Status On-going     PT LONG TERM GOAL #2   Title Imrpove flexibility in bilat hamstrings to 80-85 deg bilat 08/16/16   Time 6   Period Weeks   Status Achieved     PT LONG TERM GOAL #3   Title Patient to demo/verbalize understanding of proper transitional movements/transfers/lifting technique 08/16/16   Time 6   Period Weeks   Status On-going     PT LONG TERM GOAL #4   Title Independent in HEP 08/16/16   Time 6   Period Weeks   Status  On-going     PT LONG TERM GOAL #5   Title Improve FOTO to </= 27% limitation 08/16/16   Time 6   Period Weeks   Status On-going               Plan - 08/13/16 0908    Clinical Impression Statement Pt tolerated all exercises well without increase in pain.  Moving focus from pain/ symptom reduction to spine stabilization and body mechanics for job related tasks.  Pt reported elimination of pain at end of session.  Pt making good gains towards unmet goals, beginning to use better body mechanics with work.    Rehab Potential Good   PT Frequency 2x / week   PT Duration 6 weeks   PT Treatment/Interventions Patient/family education;ADLs/Self Care Home Management;Neuromuscular re-education;Cryotherapy;Electrical Stimulation;Iontophoresis 4mg /ml Dexamethasone;Moist Heat;Traction;Ultrasound;Manual techniques;Dry needling;Therapeutic activities;Therapeutic exercise   PT Next Visit Plan Continue US and traction, and progressive lumbar stabilization. Add simulated (work-related) lifting exercises with core engagement.    Consulted and Agree with Plan of Care Patient      Patient will benefit from skilled therapeutic intervention in order to improve the following deficits and impairments:  Postural dysfunction, Improper body mechanics, Pain, Decreased range of motion, Decreased mobility, Decreased activity tolerance  Visit Diagnosis: Other symptoms and signs involving the musculoskeletal system  Bilateral sciatica     Problem List Patient Active Problem List   Diagnosis Date Noted  . Spondylosis of lumbar region without myelopathy or radiculopathy 06/29/2016   Mayer CamelJennifer Carlson-Long, PTA 08/13/16 11:44 AM  [facility] [address] [place], [place], [address] Phone: [phone]   Fax:  [phone]  Name: Kurtis BushmanCharlie Shenk MRN: [id number] Date of Birth: 02/10/1951

## 2016-08-16 ENCOUNTER — Ambulatory Visit (INDEPENDENT_AMBULATORY_CARE_PROVIDER_SITE_OTHER): Payer: Medicare HMO | Admitting: Physical Therapy

## 2016-08-16 DIAGNOSIS — M5432 Sciatica, left side: Secondary | ICD-10-CM

## 2016-08-16 DIAGNOSIS — R29898 Other symptoms and signs involving the musculoskeletal system: Secondary | ICD-10-CM | POA: Diagnosis not present

## 2016-08-16 DIAGNOSIS — M5431 Sciatica, right side: Secondary | ICD-10-CM

## 2016-08-16 NOTE — Therapy (Addendum)
[place], [place], [address] Phone: [phone]   Fax:  [phone]  Physical Therapy Treatment  Patient Details  Name: Mike Bauer MRN: [id number] Date of Birth: Nov 07, 1951 Referring Provider: Dr. Helane Rima  Encounter Date: 08/16/2016      PT End of Session - 08/16/16 0808    Visit Number 11   Number of Visits 12   Date for PT Re-Evaluation 08/16/16   PT Start Time 0800   PT Stop Time 0908   PT Time Calculation (min) 68 min   Activity Tolerance Patient tolerated treatment well;No increased pain      Past Medical History:  Diagnosis Date  . Hypertension     Past Surgical History:  Procedure Laterality Date  . EYE SURGERY Left     There were no vitals filed for this visit.      Subjective Assessment - 08/16/16 0815    Subjective Pt reports he has had 85-90% improvement since initiating therapy.  He now asks for help for lifting tasks and has someone else weedeat at work, so that he does not re-injure his back.     Currently in Pain? Yes   Pain Score 1    Pain Location Back   Pain Orientation Right   Pain Descriptors / Indicators Dull   Aggravating Factors  lifting, twisting   Pain Relieving Factors TENS, rest             [facility] PT Assessment - 08/16/16 0001      Assessment   Medical Diagnosis spondylosis lumbar spine    Referring Provider Dr. Helane Rima   Onset Date/Surgical Date 02/21/16   Hand Dominance Right           [facility] Adult PT Treatment/Exercise - 08/16/16 0001      Lumbar Exercises: Stretches   Passive Hamstring Stretch 3 reps  45 sec, supine with strap   Press Ups --  10 x 2-3 sec hold    ITB Stretch 3 reps;30 seconds   Piriformis Stretch 2 reps;30 seconds  seated     Lumbar Exercises: Aerobic   Stationary Bike NuStep L5: 6 min      Knee/Hip Exercises: Stretches   Sports administrator Right;Left;3 reps;30 seconds   Gastroc Stretch Right;Left;3  reps;30 seconds     Modalities   Modalities Iontophoresis;Ultrasound;Traction     Ultrasound   Ultrasound Location Rt piriformis/ Rt lumbar paraspinals   Ultrasound Parameters 100%, 1.1 w/cm2, 8 min    Ultrasound Goals Pain;Other (Comment)  tightness.      Iontophoresis   Type of Iontophoresis Dexamethasone   Location Rt SI joint   Dose 1.0 cc   Time 8 hr patch, 120 mA.      Traction   Type of Traction Lumbar   Min (lbs) 40   Max (lbs) 55   Hold Time 60   Rest Time 20   Time 20                     PT Long Term Goals - 08/06/16 1300      PT LONG TERM GOAL #1   Title Improve sitting and standing posture with patient to verbalize and demonstrate good body mechanics with sitting and standing 08/16/16   Time 6   Period Weeks   Status On-going     PT LONG TERM GOAL #2   Title Imrpove flexibility in bilat hamstrings to 80-85 deg bilat 08/16/16   Time 6  Period Weeks   Status Achieved     PT LONG TERM GOAL #3   Title Patient to demo/verbalize understanding of proper transitional movements/transfers/lifting technique 08/16/16   Time 6   Period Weeks   Status On-going     PT LONG TERM GOAL #4   Title Independent in HEP 08/16/16   Time 6   Period Weeks   Status On-going     PT LONG TERM GOAL #5   Title Improve FOTO to </= 27% limitation 08/16/16   Time 6   Period Weeks   Status On-going               Plan - 08/16/16 1351    Clinical Impression Statement Pt scored lower on FOTO survey, although verbally he reports almost 90% improvement.  He tolerated treatment well without any increase in symptoms.  His posture is improving, as is his body mechanics awareness.  He has partially met his goals and wishes to hold therapy while he works on his HEP.      Rehab Potential Good   PT Frequency 2x / week   PT Duration 6 weeks   PT Treatment/Interventions Patient/family education;ADLs/Self Care Home Management;Neuromuscular  re-education;Cryotherapy;Electrical Stimulation;Iontophoresis 55m/ml Dexamethasone;Moist Heat;Traction;Ultrasound;Manual techniques;Dry needling;Therapeutic activities;Therapeutic exercise   PT Next Visit Plan Hold therapy for 2 wks; if pt returns will assess response to ionto and continue lumbar stabilization.  If pt doesn't return by Oct 10/24, will d/c .   Consulted and Agree with Plan of Care Patient      Patient will benefit from skilled therapeutic intervention in order to improve the following deficits and impairments:  Postural dysfunction, Improper body mechanics, Pain, Decreased range of motion, Decreased mobility, Decreased activity tolerance  Visit Diagnosis: Other symptoms and signs involving the musculoskeletal system  Bilateral sciatica     Problem List Patient Active Problem List   Diagnosis Date Noted  . Spondylosis of lumbar region without myelopathy or radiculopathy 06/29/2016   JKerin Perna PTA 08/16/16 1:57 PM  [place] [address]Phone: [phone]  Fax:  [phone] Name: CTarron KrolakMRN: [id number] of Birth: 402/05/1951  PHYSICAL THERAPY DISCHARGE SUMMARY  Visits from Start of Care: 11  Current functional level related to goals / functional outcomes: See progress note for functional status.    Remaining deficits: See note   Education / Equipment: HEP Plan: Patient agrees to discharge.  Patient goals were partially met. Patient is being discharged due to being pleased with the current functional level.  ?????     Celyn P. HHelene KelpPT, MPH 10/05/16 11:56 AM

## 2016-08-20 ENCOUNTER — Encounter: Payer: Medicare HMO | Admitting: Physical Therapy

## 2016-09-06 DIAGNOSIS — R69 Illness, unspecified: Secondary | ICD-10-CM | POA: Diagnosis not present

## 2016-09-16 DIAGNOSIS — M9904 Segmental and somatic dysfunction of sacral region: Secondary | ICD-10-CM | POA: Diagnosis not present

## 2016-09-16 DIAGNOSIS — M5417 Radiculopathy, lumbosacral region: Secondary | ICD-10-CM | POA: Diagnosis not present

## 2016-09-16 DIAGNOSIS — M5136 Other intervertebral disc degeneration, lumbar region: Secondary | ICD-10-CM | POA: Diagnosis not present

## 2016-09-16 DIAGNOSIS — M791 Myalgia: Secondary | ICD-10-CM | POA: Diagnosis not present

## 2016-09-20 DIAGNOSIS — M5417 Radiculopathy, lumbosacral region: Secondary | ICD-10-CM | POA: Diagnosis not present

## 2016-09-20 DIAGNOSIS — M5136 Other intervertebral disc degeneration, lumbar region: Secondary | ICD-10-CM | POA: Diagnosis not present

## 2016-09-20 DIAGNOSIS — M791 Myalgia: Secondary | ICD-10-CM | POA: Diagnosis not present

## 2016-09-20 DIAGNOSIS — M9904 Segmental and somatic dysfunction of sacral region: Secondary | ICD-10-CM | POA: Diagnosis not present

## 2016-09-27 DIAGNOSIS — M5417 Radiculopathy, lumbosacral region: Secondary | ICD-10-CM | POA: Diagnosis not present

## 2016-09-27 DIAGNOSIS — M791 Myalgia: Secondary | ICD-10-CM | POA: Diagnosis not present

## 2016-09-27 DIAGNOSIS — M9904 Segmental and somatic dysfunction of sacral region: Secondary | ICD-10-CM | POA: Diagnosis not present

## 2016-09-27 DIAGNOSIS — M5136 Other intervertebral disc degeneration, lumbar region: Secondary | ICD-10-CM | POA: Diagnosis not present

## 2016-09-28 DIAGNOSIS — R69 Illness, unspecified: Secondary | ICD-10-CM | POA: Diagnosis not present

## 2016-10-09 DIAGNOSIS — Z79899 Other long term (current) drug therapy: Secondary | ICD-10-CM | POA: Diagnosis not present

## 2016-10-09 DIAGNOSIS — R791 Abnormal coagulation profile: Secondary | ICD-10-CM | POA: Diagnosis not present

## 2016-10-09 DIAGNOSIS — R569 Unspecified convulsions: Secondary | ICD-10-CM | POA: Diagnosis not present

## 2016-10-09 DIAGNOSIS — R410 Disorientation, unspecified: Secondary | ICD-10-CM | POA: Diagnosis not present

## 2016-10-09 DIAGNOSIS — I10 Essential (primary) hypertension: Secondary | ICD-10-CM | POA: Diagnosis not present

## 2016-10-09 DIAGNOSIS — R4182 Altered mental status, unspecified: Secondary | ICD-10-CM | POA: Diagnosis not present

## 2016-10-18 DIAGNOSIS — R55 Syncope and collapse: Secondary | ICD-10-CM | POA: Diagnosis not present

## 2016-10-19 DIAGNOSIS — R55 Syncope and collapse: Secondary | ICD-10-CM | POA: Diagnosis not present

## 2017-04-12 DIAGNOSIS — R69 Illness, unspecified: Secondary | ICD-10-CM | POA: Diagnosis not present

## 2017-11-09 DIAGNOSIS — R69 Illness, unspecified: Secondary | ICD-10-CM | POA: Diagnosis not present

## 2017-12-06 DIAGNOSIS — R69 Illness, unspecified: Secondary | ICD-10-CM | POA: Diagnosis not present

## 2017-12-19 DIAGNOSIS — R69 Illness, unspecified: Secondary | ICD-10-CM | POA: Diagnosis not present

## 2018-03-29 IMAGING — DX DG LUMBAR SPINE COMPLETE 4+V
5 series · 5 of 5 positions shown · non-contrast
Comparison: None.

CLINICAL DATA: Radicular low back pain for 3 months. No recent
injury.

EXAM:
LUMBAR SPINE - COMPLETE 4+ VIEW

[l-spine ap]
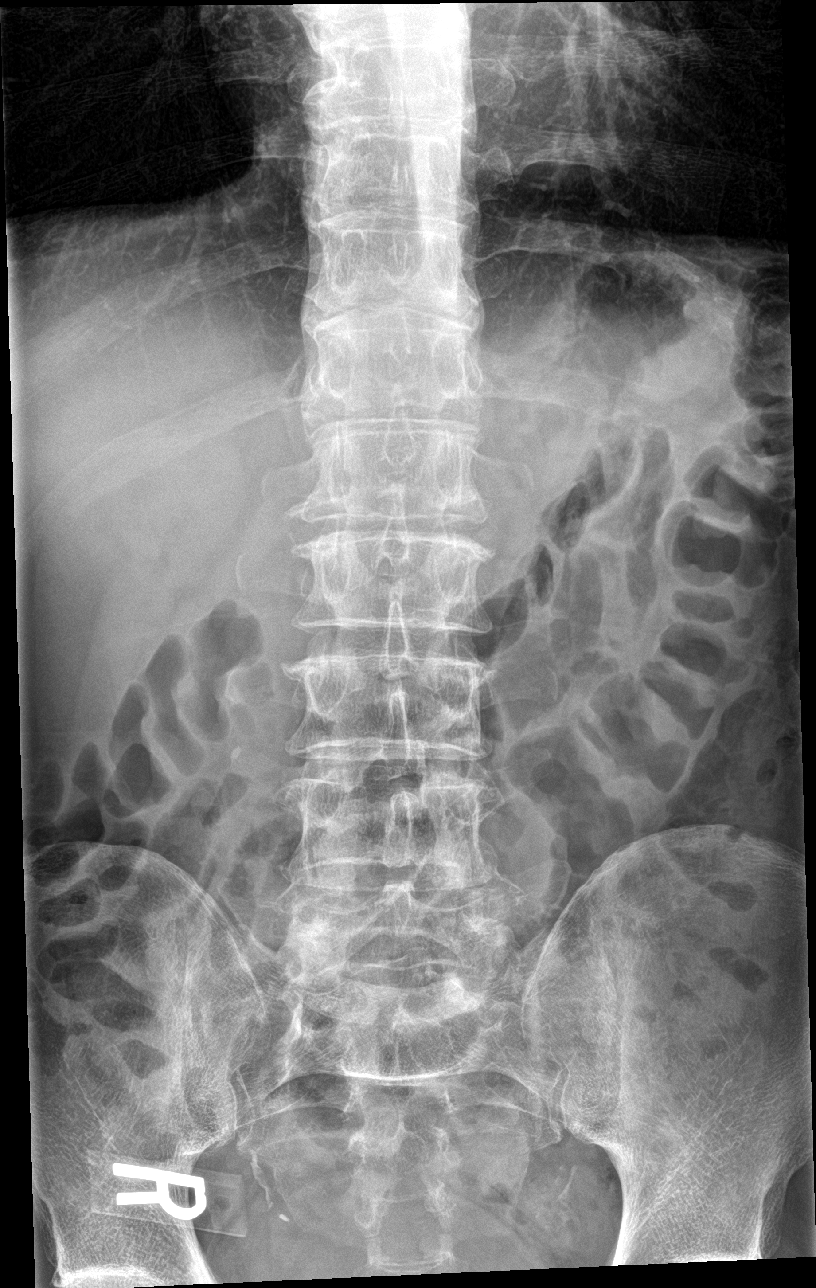

[l-spine obl (1 of 2)]
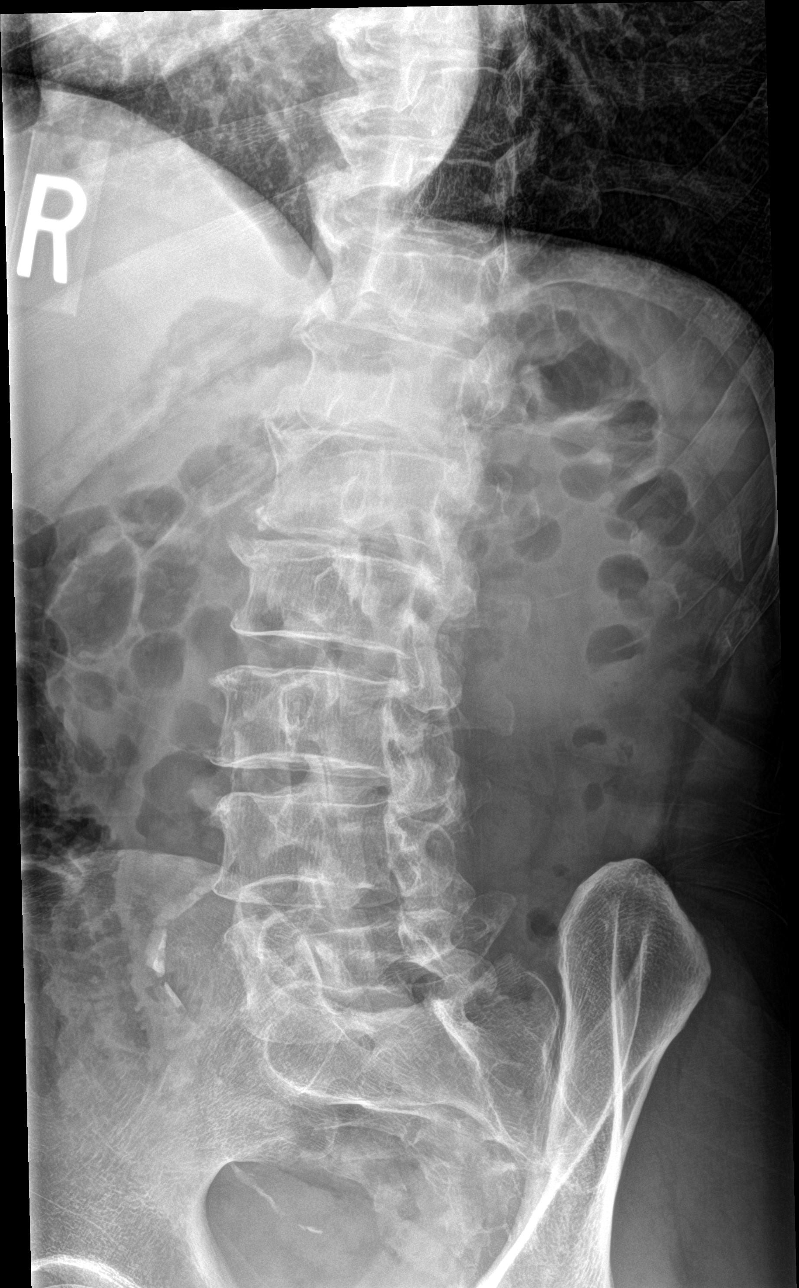

[l-spine obl (2 of 2)]
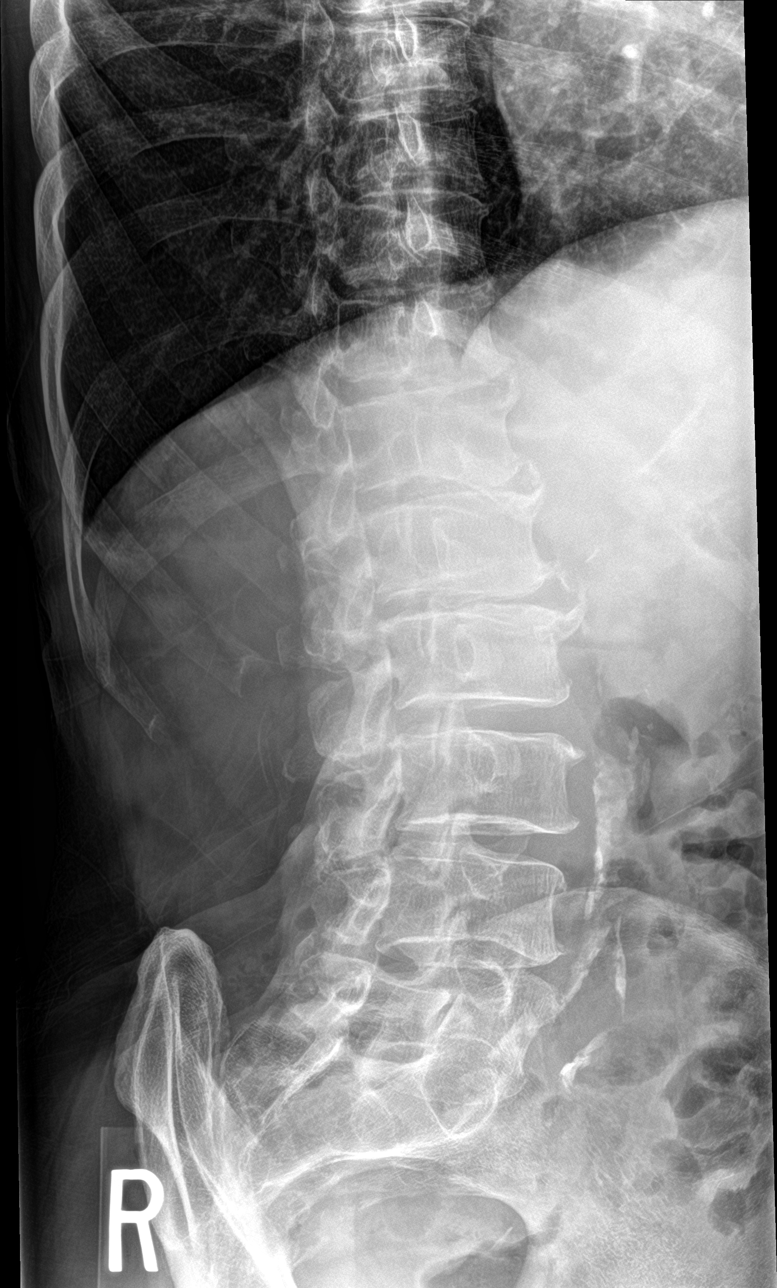

[l-spine lat]
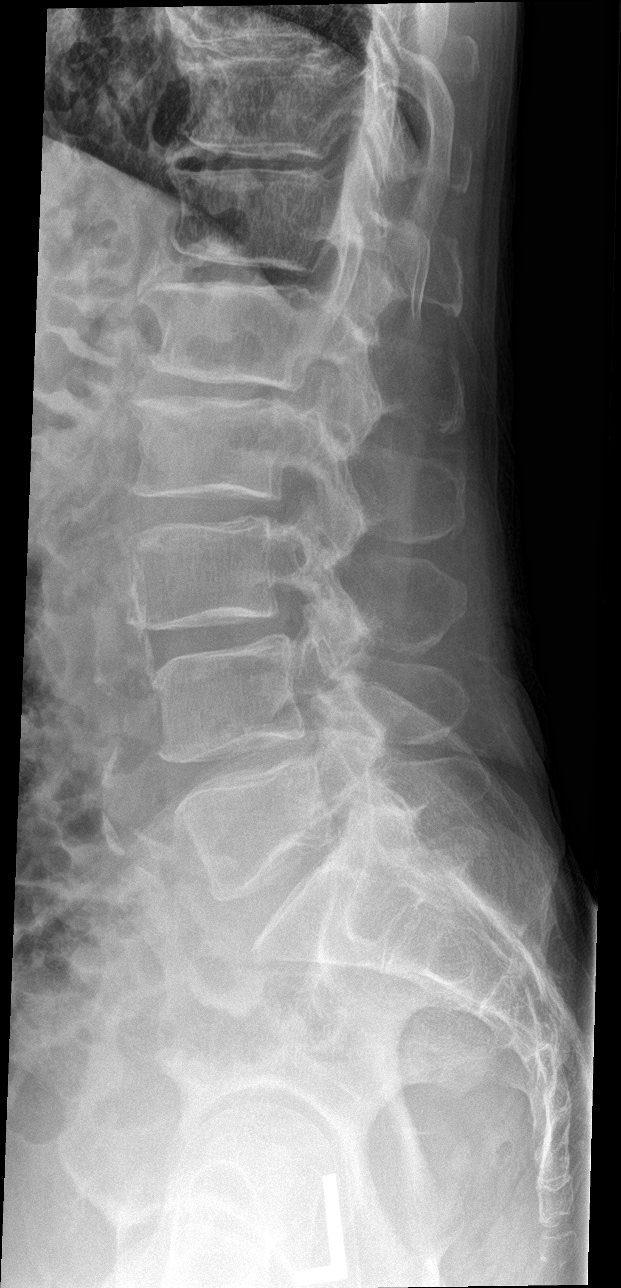

[l-spine spot]
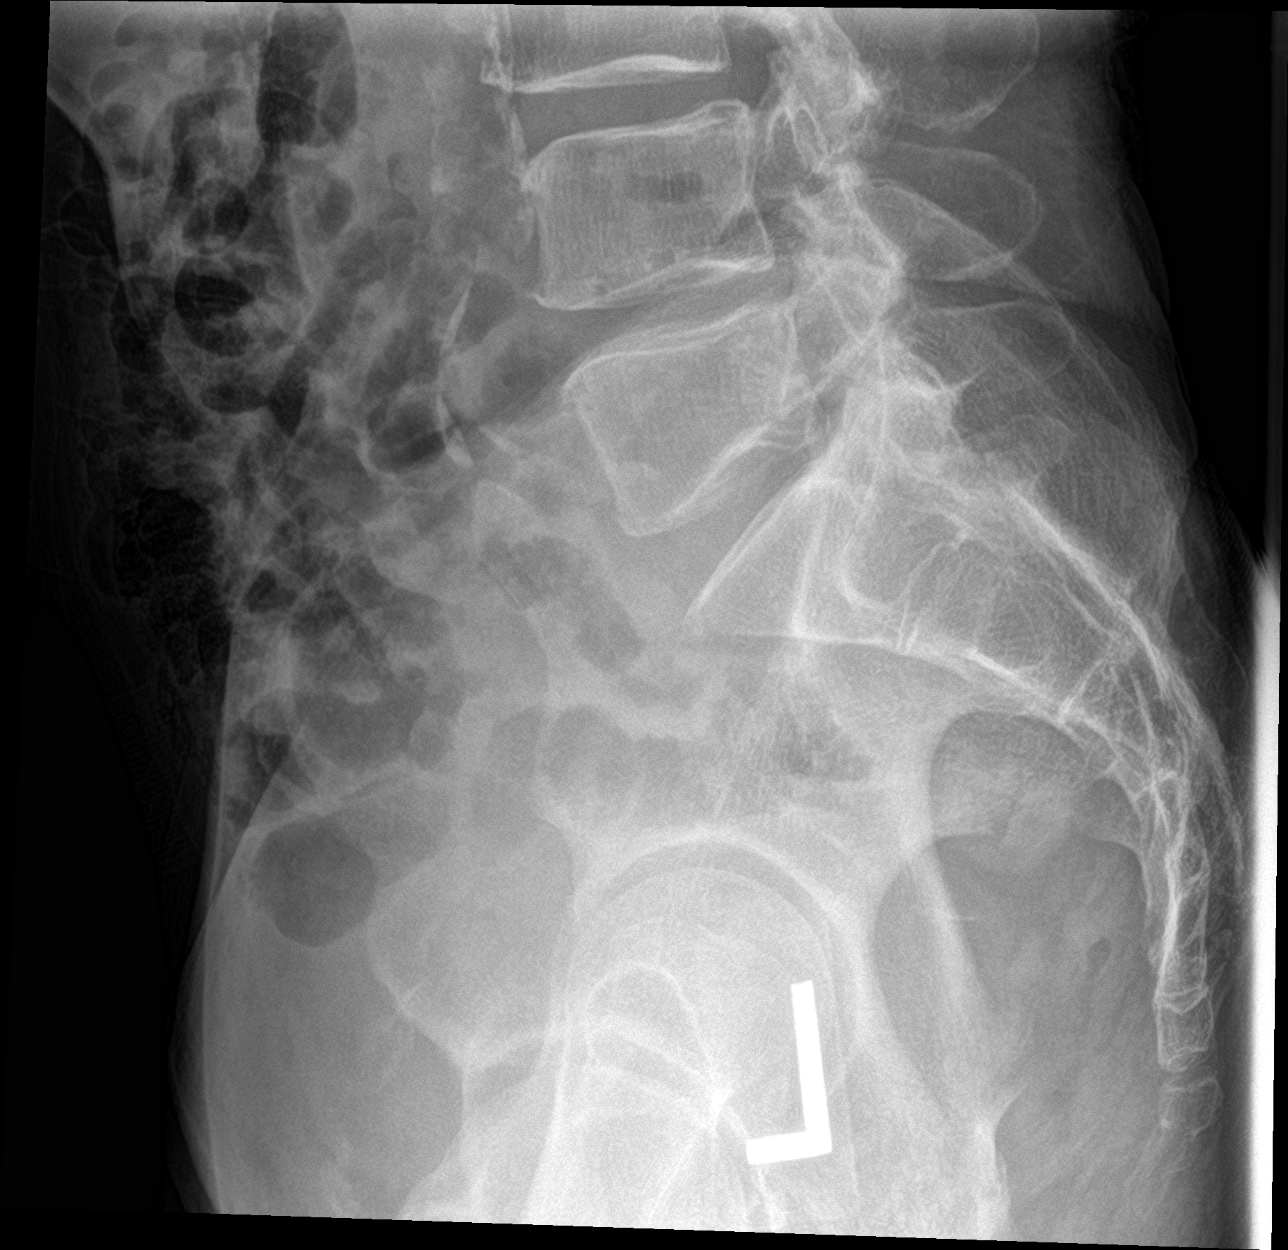

[5 of 5 positions shown; findings below may reference images not displayed]

FINDINGS: This report assumes 5 non rib-bearing lumbar vertebrae.

Lumbar vertebral body heights are preserved, with no fracture.

Mild-to-moderate spondylosis in the visualized thoracolumbar spine
with mild loss of disc height at L1-2 and L4-5. No
spondylolisthesis. Mild facet arthropathy bilaterally in the lower
lumbar spine. No aggressive appearing focal osseous lesions. Marked
atherosclerosis in the abdominal aorta.
IMPRESSION: 1. No lumbar fracture or spondylolisthesis.
2. Mild-to-moderate degenerative disc disease in the visualized
thoracolumbar spine.
3. Aortic atherosclerosis.

## 2018-05-17 DIAGNOSIS — R69 Illness, unspecified: Secondary | ICD-10-CM | POA: Diagnosis not present

## 2018-08-26 DIAGNOSIS — R69 Illness, unspecified: Secondary | ICD-10-CM | POA: Diagnosis not present

## 2018-09-05 DIAGNOSIS — R69 Illness, unspecified: Secondary | ICD-10-CM | POA: Diagnosis not present

## 2018-09-15 DIAGNOSIS — R69 Illness, unspecified: Secondary | ICD-10-CM | POA: Diagnosis not present

## 2018-09-20 DIAGNOSIS — R69 Illness, unspecified: Secondary | ICD-10-CM | POA: Diagnosis not present

## 2019-01-04 ENCOUNTER — Emergency Department (INDEPENDENT_AMBULATORY_CARE_PROVIDER_SITE_OTHER)
Admission: EM | Admit: 2019-01-04 | Discharge: 2019-01-04 | Disposition: A | Discharge status: Home / Self Care | Payer: Medicare Other | Source: Home / Self Care

## 2019-01-04 ENCOUNTER — Telehealth: Payer: Self-pay

## 2019-01-04 ENCOUNTER — Other Ambulatory Visit: Payer: Self-pay

## 2019-01-04 DIAGNOSIS — R21 Rash and other nonspecific skin eruption: Secondary | ICD-10-CM | POA: Diagnosis not present

## 2019-01-04 MED ORDER — CETIRIZINE HCL 5 MG PO TABS: 5.0000 mg | ORAL_TABLET | Freq: Every day | ORAL | 0 refills | Status: DC

## 2019-01-04 MED ORDER — CETIRIZINE HCL 5 MG PO TABS: 5.0000 mg | ORAL_TABLET | Freq: Every day | ORAL | 0 refills | Status: AC

## 2019-01-04 MED ORDER — TRIAMCINOLONE ACETONIDE 0.1 % EX CREA: 1.0000 "application " | TOPICAL_CREAM | Freq: Two times a day (BID) | CUTANEOUS | 0 refills | Status: AC

## 2019-01-04 MED ORDER — PREDNISONE 50 MG PO TABS: 50.0000 mg | ORAL_TABLET | Freq: Every day | ORAL | 0 refills | Status: AC

## 2019-01-04 MED ORDER — PREDNISONE 50 MG PO TABS: 50.0000 mg | ORAL_TABLET | Freq: Every day | ORAL | 0 refills | Status: DC

## 2019-01-04 MED ORDER — TRIAMCINOLONE ACETONIDE 0.1 % EX CREA: 1.0000 "application " | TOPICAL_CREAM | Freq: Two times a day (BID) | CUTANEOUS | 0 refills | Status: DC

## 2019-01-04 NOTE — ED Provider Notes (Signed)
[facility]    CSN: [id number] Arrival date & time: 01/04/19  1048     History   Chief Complaint Chief Complaint  Patient presents with  . Rash    HPI Mike Bauer is a 68 y.o. male.   HPI Mike Bauer is a 68 y.o. male presenting to UC with c/o gradually worsening red itchy rash that started on his legs about 6 months ago after kayaking in a river.  He has since developed a large rash on his back in January 2020.  He has tried OTC Gold Bond cream, which provides mild to moderate relief of itch but wife encouraged him to be evaluated because the rash has not resolved. Pt denies pain. No new soaps, lotions or medications. No oral swelling.    Past Medical History:  Diagnosis Date  . Hypertension     Patient Active Problem List   Diagnosis Date Noted  . Spondylosis of lumbar region without myelopathy or radiculopathy 06/29/2016    Past Surgical History:  Procedure Laterality Date  . EYE SURGERY Left        Home Medications    Prior to Admission medications   Medication Sig Start Date End Date Taking? Authorizing Provider  aspirin 81 MG tablet Take 81 mg by mouth daily.    [provider]  cetirizine (ZYRTEC) 5 MG tablet Take 1 tablet (5 mg total) by mouth daily. 01/04/19   Lurene Shadow, PA-C  dorzolamide-timolol (COSOPT) 22.3-6.8 MG/ML ophthalmic solution 1 drop 2 (two) times daily.    [provider]  fluticasone (FLONASE) 50 MCG/ACT nasal spray Place 2 sprays into both nostrils daily. Patient not taking: Reported on 07/05/2016 11/22/13   Floydene Flock, MD  hydrochlorothiazide (MICROZIDE) 12.5 MG capsule Take 12.5 mg by mouth daily.    [provider]  lisinopril (PRINIVIL,ZESTRIL) 20 MG tablet Take 20 mg by mouth daily.    [provider]  meclizine (ANTIVERT) 25 MG tablet Take 1 tablet (25 mg total) by mouth 3 (three) times daily as needed. Patient not taking: Reported on 07/05/2016 05/15/13   Marlaine Hind, MD  meloxicam (MOBIC) 15 MG tablet One tab PO qAM with breakfast for 2 weeks, then daily prn pain. 06/29/16   Monica Becton, MD  predniSONE (DELTASONE) 50 MG tablet Take 1 tablet (50 mg total) by mouth daily with breakfast for 4 days. 01/04/19 01/08/19  Lurene Shadow, PA-C  triamcinolone cream (KENALOG) 0.1 % Apply 1 application topically 2 (two) times daily. As needed for itching/inflammation of rash 01/04/19   Lurene Shadow, PA-C    Family History Family History  Problem Relation Age of Onset  . Hyperlipidemia Mother   . Hypertension Mother     Social History Social History   Tobacco Use  . Smoking status: Former Smoker    Last attempt to quit: 11/22/1998    Years since quitting: 20.1  . Smokeless tobacco: Never Used  Substance Use Topics  . Alcohol use: Yes  . Drug use: No     Allergies   Patient has no known allergies.   Review of Systems Review of Systems  Constitutional: Negative for chills and fever.  Musculoskeletal: Negative for arthralgias and myalgias.  Skin: Positive for color change and rash.     Physical Exam Triage Vital Signs ED Triage Vitals [01/04/19 1123]  Enc Vitals Group     BP (!) 176/108     Pulse Rate 69     Resp  20     Temp 98.6 F (37 C)     Temp Source Oral     SpO2 98 %     Weight 137 lb (62.1 kg)     Height 5\' 8"  (1.727 m)     Head Circumference      Peak Flow      Pain Score 0     Pain Loc      Pain Edu?      Excl. in GC?    No data found.  Updated Vital Signs BP (!) 169/93   Pulse 69   Temp 98.6 F (37 C) (Oral)   Resp 20   Ht 5\' 8"  (1.727 m)   Wt 137 lb (62.1 kg)   SpO2 98%   BMI 20.83 kg/m   Visual Acuity Right Eye Distance:   Left Eye Distance:   Bilateral Distance:    Right Eye Near:   Left Eye Near:    Bilateral Near:     Physical Exam Vitals signs and nursing note reviewed.  Constitutional:      Appearance: He is well-developed.  HENT:     Head: Normocephalic and atraumatic.    Neck:     Musculoskeletal: Normal range of motion.  Cardiovascular:     Rate and Rhythm: Normal rate.  Pulmonary:     Effort: Pulmonary effort is normal.  Musculoskeletal: Normal range of motion.  Skin:    General: Skin is warm and dry.     Capillary Refill: Capillary refill takes less than 2 seconds.     Findings: Erythema and rash present.          Comments: Bilateral arms and legs: scattered dried red patches. Areas of excoriation.  Back: diffuse erythematous papular rash with areas of dried flaking skin. Rash does blanch.  Neurological:     Mental Status: He is alert and oriented to person, place, and time.  Psychiatric:        Behavior: Behavior normal.      UC Treatments / Results  Labs (all labs ordered are listed, but only abnormal results are displayed) Labs Reviewed - No data to display  EKG None  Radiology No results found.  Procedures Procedures (including critical care time)  Medications Ordered in UC Medications - No data to display  Initial Impression / Assessment and Plan / UC Course  I have reviewed the triage vital signs and the nursing notes.  Pertinent labs & imaging results that were available during my care of the patient were reviewed by me and considered in my medical decision making (see chart for details).     Rash is nondiscript.  Consulted with Dr. Cleta Alberts who also examined pt,  question severe eczema vs possible pityriasis roscea  Pt does have elevated BP. Encouraged to monitor and continue taking his BP medications as prescribed. Will tx with oral prednisone, cetirizine and topical triamcinolone.    Final Clinical Impressions(s) / UC Diagnoses   Final diagnoses:  Rash and nonspecific skin eruption     Discharge Instructions      Please monitor your blood pressure.  You may take the medications as prescribed and use the cream to help with the itching, redness, and inflammation of your skin.     ED Prescriptions     Medication Sig Dispense Auth. Provider   predniSONE (DELTASONE) 50 MG tablet Take 1 tablet (50 mg total) by mouth daily with breakfast for 4 days. 4 tablet Lurene Shadow, [place]  triamcinolone cream (KENALOG) 0.1 % Apply 1 application topically 2 (two) times daily. As needed for itching/inflammation of rash 80 g Dayzee Trower O, PA-C   cetirizine (ZYRTEC) 5 MG tablet Take 1 tablet (5 mg total) by mouth daily. 30 tablet Lurene ShadowPhelps, Reighn Kaplan O, PA-C     Controlled Substance Prescriptions [place] Controlled Substance Registry consulted? Not Applicable   Rolla Platehelps, Varick Keys O, PA-C 01/04/19 1550

## 2019-01-04 NOTE — Discharge Instructions (Signed)
°  Please monitor your blood pressure.  You may take the medications as prescribed and use the cream to help with the itching, redness, and inflammation of your skin.

## 2019-01-04 NOTE — ED Triage Notes (Signed)
Pt stated that last fall he was in the river, and afterwards started with a rash.  It started on lower left leg, and now has spread all over body except for head.

## 2019-06-05 ENCOUNTER — Emergency Department (INDEPENDENT_AMBULATORY_CARE_PROVIDER_SITE_OTHER)
Admission: EM | Admit: 2019-06-05 | Discharge: 2019-06-05 | Disposition: A | Discharge status: Home / Self Care | Payer: Medicare Other | Source: Home / Self Care

## 2019-06-05 ENCOUNTER — Other Ambulatory Visit: Payer: Self-pay

## 2019-06-05 DIAGNOSIS — S30860A Insect bite (nonvenomous) of lower back and pelvis, initial encounter: Secondary | ICD-10-CM | POA: Diagnosis not present

## 2019-06-05 DIAGNOSIS — W57XXXA Bitten or stung by nonvenomous insect and other nonvenomous arthropods, initial encounter: Secondary | ICD-10-CM | POA: Diagnosis not present

## 2019-06-05 DIAGNOSIS — R21 Rash and other nonspecific skin eruption: Secondary | ICD-10-CM

## 2019-06-05 MED ORDER — DOXYCYCLINE HYCLATE 100 MG PO CAPS: 100.0000 mg | ORAL_CAPSULE | Freq: Two times a day (BID) | ORAL | 0 refills | Status: AC

## 2019-06-05 NOTE — ED Triage Notes (Signed)
Pt had a tick bite on left mid back.  Removed last week, now has about a 6" area on the back.

## 2019-06-05 NOTE — ED Provider Notes (Signed)
[facility]    CSN: [id number] Arrival date & time: 06/05/19  1046     History   Chief Complaint Chief Complaint  Patient presents with  . Tick Removal    HPI Tytan Sandate is a 68 y.o. male.   HPI Leontae Bostock is a 68 y.o. male presenting to UC with c/o gradually worsening rash on his Left mid back after removing a tick from his back last week. He does not think the tick was on his skin for longer than 8 hours. He believes he was able to remove the entire tick.  He had mild itching, relieved with OTC cortisone cream but states prior rashes from tick bites were a lot smaller and resolved within 1-2 days.  Family is concerned the size of the rash.  Pt denies fever, chills, n/v/d.    Past Medical History:  Diagnosis Date  . Hypertension     Patient Active Problem List   Diagnosis Date Noted  . Spondylosis of lumbar region without myelopathy or radiculopathy 06/29/2016    Past Surgical History:  Procedure Laterality Date  . EYE SURGERY Left        Home Medications    Prior to Admission medications   Medication Sig Start Date End Date Taking? Authorizing Provider  aspirin 81 MG tablet Take 81 mg by mouth daily.     [provider]  cetirizine (ZYRTEC) 5 MG tablet Take 1 tablet (5 mg total) by mouth daily. 01/04/19   Noe Gens, PA-C  dorzolamide-timolol (COSOPT) 22.3-6.8 MG/ML ophthalmic solution 1 drop 2 (two) times daily.    [provider]  doxycycline (VIBRAMYCIN) 100 MG capsule Take 1 capsule (100 mg total) by mouth 2 (two) times daily for 10 days. 06/05/19 06/15/19  Noe Gens, PA-C  fluticasone (FLONASE) 50 MCG/ACT nasal spray Place 2 sprays into both nostrils daily. Patient not taking: Reported on 07/05/2016 11/22/13   Deneise Lever, MD  hydrochlorothiazide (MICROZIDE) 12.5 MG capsule Take 12.5 mg by mouth daily.    [provider]  lisinopril (PRINIVIL,ZESTRIL) 20 MG tablet Take 20 mg by mouth daily.     [provider]  meclizine (ANTIVERT) 25 MG tablet Take 1 tablet (25 mg total) by mouth 3 (three) times daily as needed. Patient not taking: Reported on 07/05/2016 05/15/13   Janeann Forehand, MD  meloxicam (MOBIC) 15 MG tablet One tab PO qAM with breakfast for 2 weeks, then daily prn pain. 06/29/16   Silverio Decamp, MD  triamcinolone cream (KENALOG) 0.1 % Apply 1 application topically 2 (two) times daily. As needed for itching/inflammation of rash 01/04/19   Noe Gens, PA-C    Family History Family History  Problem Relation Age of Onset  . Hyperlipidemia Mother   . Hypertension Mother     Social History Social History   Tobacco Use  . Smoking status: Former Smoker    Quit date: 11/22/1998    Years since quitting: 20.5  . Smokeless tobacco: Never Used  Substance Use Topics  . Alcohol use: Yes  . Drug use: No     Allergies   Patient has no known allergies.   Review of Systems Review of Systems  Constitutional: Negative for chills and fever.  Gastrointestinal: Negative for diarrhea, nausea and vomiting.  Musculoskeletal: Negative for arthralgias, back pain and myalgias.  Skin: Positive for rash. Negative for wound.  Neurological: Negative for dizziness, light-headedness and headaches.     Physical Exam Triage Vital Signs  ED Triage Vitals  Enc Vitals Group     BP 06/05/19 1126 (!) 149/84     Pulse Rate 06/05/19 1126 68     Resp 06/05/19 1126 20     Temp 06/05/19 1126 98.4 F (36.9 C)     Temp Source 06/05/19 1126 Oral     SpO2 06/05/19 1126 99 %     Weight 06/05/19 1128 134 lb (60.8 kg)     Height 06/05/19 1128 5\' 8"  (1.727 m)     Head Circumference --      Peak Flow --      Pain Score 06/05/19 1127 0     Pain Loc --      Pain Edu? --      Excl. in GC? --    No data found.  Updated Vital Signs BP (!) 149/84 (BP Location: Right Arm)   Pulse 68   Temp 98.4 F (36.9 C) (Oral)   Resp 20   Ht 5\' 8"  (1.727 m)   Wt 134 lb (60.8 kg)    SpO2 99%   BMI 20.37 kg/m   Visual Acuity Right Eye Distance:   Left Eye Distance:   Bilateral Distance:    Right Eye Near:   Left Eye Near:    Bilateral Near:     Physical Exam Vitals signs and nursing note reviewed.  Constitutional:      Appearance: Normal appearance. He is well-developed.  HENT:     Head: Normocephalic and atraumatic.  Neck:     Musculoskeletal: Normal range of motion.  Cardiovascular:     Rate and Rhythm: Normal rate.  Pulmonary:     Effort: Pulmonary effort is normal.  Musculoskeletal: Normal range of motion.        General: No tenderness.  Skin:    General: Skin is warm and dry.     Findings: Erythema and rash present.       Neurological:     Mental Status: He is alert and oriented to person, place, and time.  Psychiatric:        Behavior: Behavior normal.      UC Treatments / Results  Labs (all labs ordered are listed, but only abnormal results are displayed) Labs Reviewed - No data to display  EKG   Radiology No results found.  Procedures Procedures (including critical care time)  Medications Ordered in UC Medications - No data to display  Initial Impression / Assessment and Plan / UC Course  I have reviewed the triage vital signs and the nursing notes.  Pertinent labs & imaging results that were available during my care of the patient were reviewed by me and considered in my medical decision making (see chart for details).     Given short duration of tick on pt's back, low likelihood of tick illness, however, due to worsening erythema after 1 week, will tx for cellulitis and still cover for potential tick illness with doxycycline.   Final Clinical Impressions(s) / UC Diagnoses   Final diagnoses:  Tick bite of lower back, initial encounter  Skin rash     Discharge Instructions      Please take antibiotics as prescribed and be sure to complete entire course even if you start to feel better to ensure infection does  not come back.  Please follow up with family medicine in 1 week if not improving, sooner if new symptoms develop.     ED Prescriptions    Medication Sig Dispense Auth. Provider  doxycycline (VIBRAMYCIN) 100 MG capsule Take 1 capsule (100 mg total) by mouth 2 (two) times daily for 10 days. 20 capsule Lurene ShadowPhelps, Declynn Lopresti O, PA-C     Controlled Substance Prescriptions [place] Controlled Substance Registry consulted? Not Applicable   Rolla Platehelps, Etha Stambaugh O, PA-C 06/05/19 1443

## 2019-06-05 NOTE — Discharge Instructions (Signed)
°  Please take antibiotics as prescribed and be sure to complete entire course even if you start to feel better to ensure infection does not come back.  Please follow up with family medicine in 1 week if not improving, sooner if new symptoms develop.
